# Patient Record
Sex: Female | Born: 1965 | Race: Black or African American | Hispanic: No | Marital: Married | Smoking: Never smoker
Health system: Southern US, Community
[De-identification: ages and names within clinical notes are randomized; demographics above are authoritative.]

## PROBLEM LIST (undated history)

## (undated) DIAGNOSIS — J309 Allergic rhinitis, unspecified: Secondary | ICD-10-CM

## (undated) DIAGNOSIS — Z8639 Personal history of other endocrine, nutritional and metabolic disease: Secondary | ICD-10-CM

## (undated) DIAGNOSIS — D573 Sickle-cell trait: Secondary | ICD-10-CM

## (undated) DIAGNOSIS — Z9229 Personal history of other drug therapy: Secondary | ICD-10-CM

## (undated) DIAGNOSIS — Z872 Personal history of diseases of the skin and subcutaneous tissue: Secondary | ICD-10-CM

## (undated) DIAGNOSIS — K754 Autoimmune hepatitis: Secondary | ICD-10-CM

## (undated) DIAGNOSIS — K8301 Primary sclerosing cholangitis: Secondary | ICD-10-CM

## (undated) DIAGNOSIS — D649 Anemia, unspecified: Secondary | ICD-10-CM

## (undated) HISTORY — DX: Anemia, unspecified: D64.9

## (undated) HISTORY — DX: Personal history of other drug therapy: Z92.29

## (undated) HISTORY — DX: Sickle-cell trait: D57.3

## (undated) HISTORY — DX: Allergic rhinitis, unspecified: J30.9

## (undated) HISTORY — DX: Primary sclerosing cholangitis: K83.01

## (undated) HISTORY — PX: LIVER BIOPSY: SHX301

## (undated) HISTORY — PX: TUBAL LIGATION: SHX77

## (undated) HISTORY — DX: Personal history of other endocrine, nutritional and metabolic disease: Z86.39

## (undated) HISTORY — DX: Personal history of diseases of the skin and subcutaneous tissue: Z87.2

## (undated) HISTORY — DX: Autoimmune hepatitis: K75.4

---

## 1998-10-19 ENCOUNTER — Other Ambulatory Visit: Admission: RE | Admit: 1998-10-19 | Discharge: 1998-10-19 | Payer: Self-pay | Admitting: Obstetrics and Gynecology

## 2000-11-27 ENCOUNTER — Other Ambulatory Visit: Admission: RE | Admit: 2000-11-27 | Discharge: 2000-11-27 | Payer: Self-pay | Admitting: Family Medicine

## 2004-01-19 ENCOUNTER — Ambulatory Visit: Payer: Self-pay | Admitting: Family Medicine

## 2004-01-19 ENCOUNTER — Other Ambulatory Visit: Admission: RE | Admit: 2004-01-19 | Discharge: 2004-01-19 | Payer: Self-pay | Admitting: Family Medicine

## 2004-01-27 ENCOUNTER — Encounter: Admission: RE | Admit: 2004-01-27 | Discharge: 2004-01-27 | Payer: Self-pay | Admitting: Family Medicine

## 2004-01-29 ENCOUNTER — Ambulatory Visit: Payer: Self-pay | Admitting: Family Medicine

## 2004-02-02 ENCOUNTER — Encounter: Admission: RE | Admit: 2004-02-02 | Discharge: 2004-02-02 | Payer: Self-pay | Admitting: Family Medicine

## 2004-02-12 ENCOUNTER — Encounter: Admission: RE | Admit: 2004-02-12 | Discharge: 2004-02-12 | Payer: Self-pay | Admitting: Family Medicine

## 2004-03-11 ENCOUNTER — Ambulatory Visit: Payer: Self-pay | Admitting: Internal Medicine

## 2004-03-14 ENCOUNTER — Ambulatory Visit (HOSPITAL_COMMUNITY): Admission: RE | Admit: 2004-03-14 | Discharge: 2004-03-14 | Payer: Self-pay | Admitting: Internal Medicine

## 2004-03-14 ENCOUNTER — Ambulatory Visit: Payer: Self-pay | Admitting: Internal Medicine

## 2004-04-07 ENCOUNTER — Encounter: Payer: Self-pay | Admitting: Internal Medicine

## 2004-04-26 ENCOUNTER — Encounter: Payer: Self-pay | Admitting: Internal Medicine

## 2004-04-26 HISTORY — PX: ERCP: SHX60

## 2004-07-05 ENCOUNTER — Ambulatory Visit: Payer: Self-pay | Admitting: Internal Medicine

## 2004-07-12 ENCOUNTER — Ambulatory Visit (HOSPITAL_COMMUNITY): Admission: RE | Admit: 2004-07-12 | Discharge: 2004-07-12 | Payer: Self-pay | Admitting: Internal Medicine

## 2004-07-12 ENCOUNTER — Encounter: Payer: Self-pay | Admitting: Internal Medicine

## 2004-07-12 ENCOUNTER — Encounter (INDEPENDENT_AMBULATORY_CARE_PROVIDER_SITE_OTHER): Payer: Self-pay | Admitting: Specialist

## 2004-07-26 ENCOUNTER — Ambulatory Visit: Payer: Self-pay | Admitting: Internal Medicine

## 2004-08-09 ENCOUNTER — Ambulatory Visit: Payer: Self-pay | Admitting: Internal Medicine

## 2004-08-10 ENCOUNTER — Ambulatory Visit: Payer: Self-pay | Admitting: Internal Medicine

## 2004-08-10 ENCOUNTER — Encounter: Payer: Self-pay | Admitting: Internal Medicine

## 2004-08-29 ENCOUNTER — Ambulatory Visit: Payer: Self-pay | Admitting: Internal Medicine

## 2004-09-29 ENCOUNTER — Ambulatory Visit: Payer: Self-pay | Admitting: Internal Medicine

## 2004-10-04 ENCOUNTER — Ambulatory Visit: Payer: Self-pay | Admitting: Internal Medicine

## 2004-10-18 ENCOUNTER — Ambulatory Visit: Payer: Self-pay | Admitting: Internal Medicine

## 2004-11-22 ENCOUNTER — Ambulatory Visit: Payer: Self-pay | Admitting: Internal Medicine

## 2004-12-21 ENCOUNTER — Ambulatory Visit: Payer: Self-pay | Admitting: Internal Medicine

## 2005-01-06 ENCOUNTER — Ambulatory Visit: Payer: Self-pay | Admitting: Internal Medicine

## 2005-01-25 ENCOUNTER — Ambulatory Visit: Payer: Self-pay | Admitting: Internal Medicine

## 2005-01-25 HISTORY — PX: OTHER SURGICAL HISTORY: SHX169

## 2005-03-08 ENCOUNTER — Ambulatory Visit: Payer: Self-pay | Admitting: Internal Medicine

## 2005-04-18 ENCOUNTER — Ambulatory Visit: Payer: Self-pay | Admitting: Internal Medicine

## 2005-05-30 ENCOUNTER — Ambulatory Visit: Payer: Self-pay | Admitting: Internal Medicine

## 2005-07-18 ENCOUNTER — Ambulatory Visit: Payer: Self-pay | Admitting: Internal Medicine

## 2005-09-12 ENCOUNTER — Ambulatory Visit: Payer: Self-pay | Admitting: Internal Medicine

## 2005-11-13 ENCOUNTER — Ambulatory Visit: Payer: Self-pay | Admitting: Internal Medicine

## 2006-03-13 ENCOUNTER — Ambulatory Visit: Payer: Self-pay | Admitting: Internal Medicine

## 2006-03-13 LAB — CONVERTED CEMR LAB
Albumin: 3.6 g/dL (ref 3.5–5.2)
Basophils Absolute: 0 10*3/uL (ref 0.0–0.1)
Basophils Relative: 0.7 % (ref 0.0–1.0)
Chloride: 109 meq/L (ref 96–112)
Creatinine, Ser: 0.9 mg/dL (ref 0.4–1.2)
Eosinophils Relative: 6.6 % — ABNORMAL HIGH (ref 0.0–5.0)
Glucose, Bld: 88 mg/dL (ref 70–99)
HCT: 37.7 % (ref 36.0–46.0)
Hemoglobin: 12.8 g/dL (ref 12.0–15.0)
Monocytes Relative: 6.4 % (ref 3.0–11.0)
Neutro Abs: 3.4 10*3/uL (ref 1.4–7.7)
Neutrophils Relative %: 51.6 % (ref 43.0–77.0)
RDW: 14.4 % (ref 11.5–14.6)
Sodium: 144 meq/L (ref 135–145)
Total Bilirubin: 1 mg/dL (ref 0.3–1.2)

## 2006-05-07 ENCOUNTER — Ambulatory Visit: Payer: Self-pay | Admitting: Internal Medicine

## 2006-05-07 LAB — CONVERTED CEMR LAB
AST: 122 units/L — ABNORMAL HIGH (ref 0–37)
Basophils Absolute: 0 10*3/uL (ref 0.0–0.1)
Bilirubin, Direct: 0.2 mg/dL (ref 0.0–0.3)
Eosinophils Absolute: 0.2 10*3/uL (ref 0.0–0.6)
HCT: 40.5 % (ref 36.0–46.0)
Hemoglobin: 13.6 g/dL (ref 12.0–15.0)
Lymphocytes Relative: 19 % (ref 12.0–46.0)
MCHC: 33.7 g/dL (ref 30.0–36.0)
MCV: 83.6 fL (ref 78.0–100.0)
Monocytes Absolute: 0.3 10*3/uL (ref 0.2–0.7)
Monocytes Relative: 4.1 % (ref 3.0–11.0)
Neutro Abs: 5.7 10*3/uL (ref 1.4–7.7)
Neutrophils Relative %: 73.6 % (ref 43.0–77.0)

## 2006-05-21 ENCOUNTER — Ambulatory Visit: Payer: Self-pay | Admitting: Internal Medicine

## 2006-08-02 ENCOUNTER — Ambulatory Visit: Payer: Self-pay | Admitting: Internal Medicine

## 2006-08-02 LAB — CONVERTED CEMR LAB
Alkaline Phosphatase: 170 units/L — ABNORMAL HIGH (ref 39–117)
Basophils Relative: 0 % (ref 0.0–1.0)
Eosinophils Relative: 0.7 % (ref 0.0–5.0)
HCT: 37.3 % (ref 36.0–46.0)
Hemoglobin: 12.6 g/dL (ref 12.0–15.0)
Lymphocytes Relative: 21.5 % (ref 12.0–46.0)
Neutro Abs: 7.1 10*3/uL (ref 1.4–7.7)
Neutrophils Relative %: 74.3 % (ref 43.0–77.0)
Platelets: 236 10*3/uL (ref 150–400)
Total Bilirubin: 1.3 mg/dL — ABNORMAL HIGH (ref 0.3–1.2)
Total Protein: 6.3 g/dL (ref 6.0–8.3)
WBC: 9.6 10*3/uL (ref 4.5–10.5)

## 2006-12-12 ENCOUNTER — Ambulatory Visit: Payer: Self-pay | Admitting: Family Medicine

## 2006-12-12 DIAGNOSIS — J019 Acute sinusitis, unspecified: Secondary | ICD-10-CM

## 2006-12-12 DIAGNOSIS — D573 Sickle-cell trait: Secondary | ICD-10-CM

## 2007-01-01 ENCOUNTER — Ambulatory Visit: Payer: Self-pay | Admitting: Internal Medicine

## 2007-01-01 LAB — CONVERTED CEMR LAB
Basophils Relative: 4.6 % — ABNORMAL HIGH (ref 0.0–1.0)
Eosinophils Relative: 1.8 % (ref 0.0–5.0)
HDL: 53.1 mg/dL (ref >39.0)
Hemoglobin: 12.7 g/dL (ref 12.0–15.0)
Lymphocytes Relative: 23.7 % (ref 12.0–46.0)
Monocytes Absolute: 0.3 10*3/uL (ref 0.2–0.7)
Monocytes Relative: 3.5 % (ref 3.0–11.0)
Neutro Abs: 6 10*3/uL (ref 1.4–7.7)
Total Bilirubin: 1.5 mg/dL — ABNORMAL HIGH (ref 0.3–1.2)
Total Protein: 6.8 g/dL (ref 6.0–8.3)
VLDL: 15 mg/dL (ref 0–40)
WBC: 9 10*3/uL (ref 4.5–10.5)

## 2007-01-10 ENCOUNTER — Ambulatory Visit: Payer: Self-pay | Admitting: Internal Medicine

## 2007-01-24 ENCOUNTER — Ambulatory Visit: Payer: Self-pay | Admitting: Internal Medicine

## 2007-01-24 LAB — CONVERTED CEMR LAB
ALT: 58 units/L — ABNORMAL HIGH (ref 0–35)
AST: 69 units/L — ABNORMAL HIGH (ref 0–37)
Basophils Absolute: 0.1 10*3/uL (ref 0.0–0.1)
Bilirubin, Direct: 0.3 mg/dL (ref 0.0–0.3)
Eosinophils Relative: 0.8 % (ref 0.0–5.0)
HCT: 40.8 % (ref 36.0–46.0)
Neutrophils Relative %: 74.8 % (ref 43.0–77.0)
RBC: 4.76 M/uL (ref 3.87–5.11)
RDW: 14.3 % (ref 11.5–14.6)
Total Protein: 6.9 g/dL (ref 6.0–8.3)
WBC: 7.6 10*3/uL (ref 4.5–10.5)

## 2007-02-04 ENCOUNTER — Telehealth: Payer: Self-pay | Admitting: Family Medicine

## 2007-02-06 ENCOUNTER — Ambulatory Visit: Payer: Self-pay | Admitting: Family Medicine

## 2007-03-08 ENCOUNTER — Ambulatory Visit: Payer: Self-pay | Admitting: Internal Medicine

## 2007-03-08 LAB — CONVERTED CEMR LAB
AST: 38 units/L — ABNORMAL HIGH (ref 0–37)
Albumin: 3.4 g/dL — ABNORMAL LOW (ref 3.5–5.2)
Basophils Absolute: 0.2 10*3/uL — ABNORMAL HIGH (ref 0.0–0.1)
Eosinophils Absolute: 0.1 10*3/uL (ref 0.0–0.6)
Hemoglobin: 13 g/dL (ref 12.0–15.0)
MCHC: 34 g/dL (ref 30.0–36.0)
Monocytes Absolute: 0.3 10*3/uL (ref 0.2–0.7)
Monocytes Relative: 5.1 % (ref 3.0–11.0)
RDW: 14.3 % (ref 11.5–14.6)

## 2007-03-16 DIAGNOSIS — J309 Allergic rhinitis, unspecified: Secondary | ICD-10-CM | POA: Insufficient documentation

## 2007-03-16 DIAGNOSIS — R21 Rash and other nonspecific skin eruption: Secondary | ICD-10-CM

## 2007-04-02 ENCOUNTER — Ambulatory Visit: Payer: Self-pay | Admitting: Internal Medicine

## 2007-05-01 ENCOUNTER — Ambulatory Visit: Payer: Self-pay | Admitting: Internal Medicine

## 2007-05-01 LAB — CONVERTED CEMR LAB
Bilirubin, Direct: 0.2 mg/dL (ref 0.0–0.3)
Eosinophils Absolute: 0.1 10*3/uL (ref 0.0–0.6)
Eosinophils Relative: 1.1 % (ref 0.0–5.0)
HCT: 38.8 % (ref 36.0–46.0)
Hemoglobin: 12.5 g/dL (ref 12.0–15.0)
Lymphocytes Relative: 21 % (ref 12.0–46.0)
MCV: 86.2 fL (ref 78.0–100.0)
Monocytes Absolute: 0.4 10*3/uL (ref 0.2–0.7)
Neutro Abs: 5.2 10*3/uL (ref 1.4–7.7)
Neutrophils Relative %: 72.6 % (ref 43.0–77.0)
Platelets: 239 10*3/uL (ref 150–400)
Total Bilirubin: 1.2 mg/dL (ref 0.3–1.2)
Total Protein: 6.5 g/dL (ref 6.0–8.3)
WBC: 7.2 10*3/uL (ref 4.5–10.5)

## 2007-06-18 ENCOUNTER — Encounter: Payer: Self-pay | Admitting: Internal Medicine

## 2007-06-18 ENCOUNTER — Ambulatory Visit (HOSPITAL_COMMUNITY): Admission: RE | Admit: 2007-06-18 | Discharge: 2007-06-18 | Payer: Self-pay | Admitting: Internal Medicine

## 2007-06-18 ENCOUNTER — Encounter (INDEPENDENT_AMBULATORY_CARE_PROVIDER_SITE_OTHER): Payer: Self-pay | Admitting: Interventional Radiology

## 2007-06-27 ENCOUNTER — Telehealth: Payer: Self-pay | Admitting: Internal Medicine

## 2007-07-18 ENCOUNTER — Telehealth: Payer: Self-pay | Admitting: Internal Medicine

## 2007-07-24 ENCOUNTER — Ambulatory Visit: Payer: Self-pay | Admitting: Internal Medicine

## 2007-07-26 LAB — CONVERTED CEMR LAB
ALT: 34 units/L (ref 0–35)
AST: 40 units/L — ABNORMAL HIGH (ref 0–37)
Albumin: 3.5 g/dL (ref 3.5–5.2)
Alkaline Phosphatase: 171 units/L — ABNORMAL HIGH (ref 39–117)
Basophils Relative: 0.3 % (ref 0.0–1.0)
Hemoglobin: 12.4 g/dL (ref 12.0–15.0)
Lymphocytes Relative: 20.8 % (ref 12.0–46.0)
Monocytes Relative: 5.5 % (ref 3.0–12.0)
Neutro Abs: 5.5 10*3/uL (ref 1.4–7.7)
Neutrophils Relative %: 72.2 % (ref 43.0–77.0)
RBC: 4.25 M/uL (ref 3.87–5.11)
Total Bilirubin: 1.1 mg/dL (ref 0.3–1.2)
WBC: 7.6 10*3/uL (ref 4.5–10.5)

## 2007-08-07 ENCOUNTER — Telehealth: Payer: Self-pay | Admitting: Internal Medicine

## 2007-10-23 ENCOUNTER — Telehealth (INDEPENDENT_AMBULATORY_CARE_PROVIDER_SITE_OTHER): Payer: Self-pay

## 2007-10-24 ENCOUNTER — Ambulatory Visit: Payer: Self-pay | Admitting: Internal Medicine

## 2007-10-25 LAB — CONVERTED CEMR LAB
Alkaline Phosphatase: 164 units/L — ABNORMAL HIGH (ref 39–117)
Bilirubin, Direct: 0.2 mg/dL (ref 0.0–0.3)
Eosinophils Absolute: 0.1 10*3/uL (ref 0.0–0.7)
Eosinophils Relative: 1.4 % (ref 0.0–5.0)
HCT: 36.1 % (ref 36.0–46.0)
MCV: 86.7 fL (ref 78.0–100.0)
Monocytes Absolute: 0.3 10*3/uL (ref 0.1–1.0)
Neutrophils Relative %: 77.4 % — ABNORMAL HIGH (ref 43.0–77.0)
Platelets: 273 10*3/uL (ref 150–400)
RDW: 15.5 % — ABNORMAL HIGH (ref 11.5–14.6)
Total Bilirubin: 1 mg/dL (ref 0.3–1.2)
Total Protein: 7 g/dL (ref 6.0–8.3)
WBC: 7.2 10*3/uL (ref 4.5–10.5)

## 2008-01-21 ENCOUNTER — Ambulatory Visit: Payer: Self-pay | Admitting: Internal Medicine

## 2008-01-22 LAB — CONVERTED CEMR LAB
AST: 41 units/L — ABNORMAL HIGH (ref 0–37)
Albumin: 3.4 g/dL — ABNORMAL LOW (ref 3.5–5.2)
Basophils Absolute: 0 10*3/uL (ref 0.0–0.1)
Basophils Relative: 0.1 % (ref 0.0–3.0)
Eosinophils Absolute: 0.1 10*3/uL (ref 0.0–0.7)
HCT: 37.8 % (ref 36.0–46.0)
Hemoglobin: 13 g/dL (ref 12.0–15.0)
MCHC: 34.4 g/dL (ref 30.0–36.0)
MCV: 84.5 fL (ref 78.0–100.0)
Monocytes Absolute: 0.4 10*3/uL (ref 0.1–1.0)
Neutro Abs: 5.3 10*3/uL (ref 1.4–7.7)
RBC: 4.48 M/uL (ref 3.87–5.11)
RDW: 14.7 % — ABNORMAL HIGH (ref 11.5–14.6)
Total Bilirubin: 1 mg/dL (ref 0.3–1.2)

## 2008-03-18 ENCOUNTER — Ambulatory Visit: Payer: Self-pay | Admitting: Internal Medicine

## 2008-03-18 DIAGNOSIS — K754 Autoimmune hepatitis: Secondary | ICD-10-CM

## 2008-03-18 DIAGNOSIS — E559 Vitamin D deficiency, unspecified: Secondary | ICD-10-CM | POA: Insufficient documentation

## 2008-03-18 LAB — CONVERTED CEMR LAB: Vit D, 1,25-Dihydroxy: 69 (ref 30–89)

## 2008-03-19 LAB — CONVERTED CEMR LAB
Albumin: 3.5 g/dL (ref 3.5–5.2)
Alkaline Phosphatase: 153 units/L — ABNORMAL HIGH (ref 39–117)
BUN: 9 mg/dL (ref 6–23)
Basophils Relative: 0.3 % (ref 0.0–3.0)
Calcium: 9.3 mg/dL (ref 8.4–10.5)
Chloride: 105 meq/L (ref 96–112)
Creatinine, Ser: 0.7 mg/dL (ref 0.4–1.2)
Eosinophils Relative: 1.8 % (ref 0.0–5.0)
GFR calc non Af Amer: 99 mL/min
Glucose, Bld: 88 mg/dL (ref 70–99)
HCT: 39 % (ref 36.0–46.0)
Hemoglobin: 13 g/dL (ref 12.0–15.0)
MCV: 86.1 fL (ref 78.0–100.0)
Monocytes Absolute: 1.1 10*3/uL — ABNORMAL HIGH (ref 0.1–1.0)
Monocytes Relative: 14.4 % — ABNORMAL HIGH (ref 3.0–12.0)
Platelets: 275 10*3/uL (ref 150–400)
Potassium: 3.8 meq/L (ref 3.5–5.1)
RBC: 4.53 M/uL (ref 3.87–5.11)
WBC: 7.5 10*3/uL (ref 4.5–10.5)

## 2008-05-27 ENCOUNTER — Telehealth (INDEPENDENT_AMBULATORY_CARE_PROVIDER_SITE_OTHER): Payer: Self-pay | Admitting: *Deleted

## 2008-06-23 ENCOUNTER — Ambulatory Visit: Payer: Self-pay | Admitting: Internal Medicine

## 2008-06-24 LAB — CONVERTED CEMR LAB
AST: 36 units/L (ref 0–37)
Alkaline Phosphatase: 171 units/L — ABNORMAL HIGH (ref 39–117)
Basophils Absolute: 0 10*3/uL (ref 0.0–0.1)
Bilirubin, Direct: 0.2 mg/dL (ref 0.0–0.3)
Lymphocytes Relative: 23 % (ref 12.0–46.0)
Monocytes Relative: 4.1 % (ref 3.0–12.0)
Platelets: 239 10*3/uL (ref 150.0–400.0)
RDW: 14.1 % (ref 11.5–14.6)
Total Bilirubin: 0.9 mg/dL (ref 0.3–1.2)
WBC: 7.5 10*3/uL (ref 4.5–10.5)

## 2008-06-26 ENCOUNTER — Telehealth: Payer: Self-pay | Admitting: Internal Medicine

## 2008-07-14 ENCOUNTER — Ambulatory Visit: Payer: Self-pay | Admitting: Family Medicine

## 2008-07-17 LAB — CONVERTED CEMR LAB
AST: 42 units/L — ABNORMAL HIGH (ref 0–37)
Alkaline Phosphatase: 159 units/L — ABNORMAL HIGH (ref 39–117)
Basophils Absolute: 0 10*3/uL (ref 0.0–0.1)
Blood in Urine, dipstick: NEGATIVE
Calcium: 9.1 mg/dL (ref 8.4–10.5)
GFR calc non Af Amer: 118.46 mL/min (ref >60)
Glucose, Bld: 76 mg/dL (ref 70–99)
HDL: 54.6 mg/dL (ref >39.00)
Hemoglobin: 13.1 g/dL (ref 12.0–15.0)
LDL Cholesterol: 126 mg/dL — ABNORMAL HIGH (ref 0–99)
Lymphocytes Relative: 22.4 % (ref 12.0–46.0)
Monocytes Relative: 6.4 % (ref 3.0–12.0)
Neutro Abs: 4.2 10*3/uL (ref 1.4–7.7)
Nitrite: POSITIVE
Protein, U semiquant: NEGATIVE
RDW: 14 % (ref 11.5–14.6)
Sodium: 144 meq/L (ref 135–145)
Total Bilirubin: 1.3 mg/dL — ABNORMAL HIGH (ref 0.3–1.2)
Total CHOL/HDL Ratio: 4
Urobilinogen, UA: 0.2
VLDL: 13 mg/dL (ref 0.0–40.0)
pH: 5

## 2008-07-21 ENCOUNTER — Other Ambulatory Visit: Admission: RE | Admit: 2008-07-21 | Discharge: 2008-07-21 | Payer: Self-pay | Admitting: Family Medicine

## 2008-07-21 ENCOUNTER — Encounter: Payer: Self-pay | Admitting: Family Medicine

## 2008-07-21 ENCOUNTER — Ambulatory Visit: Payer: Self-pay | Admitting: Family Medicine

## 2008-07-24 ENCOUNTER — Encounter: Payer: Self-pay | Admitting: Family Medicine

## 2008-07-27 ENCOUNTER — Ambulatory Visit: Payer: Self-pay | Admitting: Internal Medicine

## 2008-08-26 ENCOUNTER — Telehealth: Payer: Self-pay | Admitting: Internal Medicine

## 2008-11-11 ENCOUNTER — Telehealth: Payer: Self-pay | Admitting: Internal Medicine

## 2008-12-03 ENCOUNTER — Telehealth: Payer: Self-pay | Admitting: Internal Medicine

## 2008-12-14 ENCOUNTER — Telehealth: Payer: Self-pay | Admitting: Internal Medicine

## 2008-12-17 ENCOUNTER — Ambulatory Visit: Payer: Self-pay | Admitting: Internal Medicine

## 2008-12-21 LAB — CONVERTED CEMR LAB
Albumin: 3.5 g/dL (ref 3.5–5.2)
Basophils Absolute: 0 10*3/uL (ref 0.0–0.1)
Eosinophils Absolute: 0.1 10*3/uL (ref 0.0–0.7)
HCT: 36.8 % (ref 36.0–46.0)
Hemoglobin: 12.1 g/dL (ref 12.0–15.0)
Lymphs Abs: 1.8 10*3/uL (ref 0.7–4.0)
MCHC: 32.9 g/dL (ref 30.0–36.0)
MCV: 88.8 fL (ref 78.0–100.0)
Neutro Abs: 4.9 10*3/uL (ref 1.4–7.7)
RDW: 15 % — ABNORMAL HIGH (ref 11.5–14.6)

## 2009-03-11 ENCOUNTER — Ambulatory Visit: Payer: Self-pay | Admitting: Internal Medicine

## 2009-03-15 LAB — CONVERTED CEMR LAB
CO2: 30 meq/L (ref 19–32)
Calcium: 9.9 mg/dL (ref 8.4–10.5)
Chloride: 104 meq/L (ref 96–112)
Eosinophils Relative: 0.3 % (ref 0.0–5.0)
GFR calc non Af Amer: 118.08 mL/min (ref >60)
Glucose, Bld: 94 mg/dL (ref 70–99)
HCT: 40.1 % (ref 36.0–46.0)
Hemoglobin: 12.7 g/dL (ref 12.0–15.0)
Lymphocytes Relative: 18.9 % (ref 12.0–46.0)
Lymphs Abs: 1.4 10*3/uL (ref 0.7–4.0)
Monocytes Relative: 5 % (ref 3.0–12.0)
Neutro Abs: 5.7 10*3/uL (ref 1.4–7.7)
Platelets: 245 10*3/uL (ref 150.0–400.0)
Sodium: 143 meq/L (ref 135–145)
Total Bilirubin: 1.1 mg/dL (ref 0.3–1.2)
Total Protein: 7.2 g/dL (ref 6.0–8.3)
WBC: 7.5 10*3/uL (ref 4.5–10.5)

## 2009-03-16 ENCOUNTER — Telehealth: Payer: Self-pay | Admitting: Internal Medicine

## 2009-03-19 ENCOUNTER — Encounter (INDEPENDENT_AMBULATORY_CARE_PROVIDER_SITE_OTHER): Payer: Self-pay

## 2009-04-19 ENCOUNTER — Telehealth: Payer: Self-pay | Admitting: Internal Medicine

## 2009-04-22 ENCOUNTER — Encounter: Payer: Self-pay | Admitting: Internal Medicine

## 2009-04-22 ENCOUNTER — Telehealth: Payer: Self-pay | Admitting: Internal Medicine

## 2009-05-17 ENCOUNTER — Encounter: Payer: Self-pay | Admitting: Family Medicine

## 2009-05-17 ENCOUNTER — Ambulatory Visit: Payer: Self-pay | Admitting: Gastroenterology

## 2009-05-20 ENCOUNTER — Encounter: Payer: Self-pay | Admitting: Internal Medicine

## 2009-06-08 ENCOUNTER — Encounter: Payer: Self-pay | Admitting: Internal Medicine

## 2009-06-17 ENCOUNTER — Telehealth: Payer: Self-pay | Admitting: Family Medicine

## 2009-07-28 ENCOUNTER — Encounter (INDEPENDENT_AMBULATORY_CARE_PROVIDER_SITE_OTHER): Payer: Self-pay | Admitting: *Deleted

## 2009-08-24 ENCOUNTER — Encounter: Payer: Self-pay | Admitting: Internal Medicine

## 2009-09-06 ENCOUNTER — Ambulatory Visit: Payer: Self-pay | Admitting: Internal Medicine

## 2009-10-07 ENCOUNTER — Ambulatory Visit: Payer: Self-pay | Admitting: Internal Medicine

## 2009-10-28 ENCOUNTER — Telehealth: Payer: Self-pay | Admitting: Internal Medicine

## 2009-12-13 ENCOUNTER — Ambulatory Visit: Payer: Self-pay | Admitting: Gastroenterology

## 2009-12-13 ENCOUNTER — Encounter: Payer: Self-pay | Admitting: Family Medicine

## 2010-01-13 ENCOUNTER — Telehealth: Payer: Self-pay | Admitting: Internal Medicine

## 2010-01-30 IMAGING — US US BIOPSY
1 series · 12 of 12 positions shown · non-contrast
Comparison: none

Clinical: Autoimmune hepatitis; request is made for random core
liver biopsy

ULTRASOUND-GUIDED RANDOM CORE LIVER BIOPSY:
An ultrasound guided liver biopsy was thoroughly discussed with the
patient and questions were answered. The benefits, risks,
alternatives, and complications were also discussed. The patient
understands and wishes to proceed with the procedure. A verbal as
well as written consent was obtained.
Survey ultrasound of the liver was performed and an appropriate
skin entry site was determined. Skin site was marked, prepped with
Betadine, and draped in usual sterile fashion, and infiltrated
locally with 1% Lidocaine. 100 mcg Intravenous Fentanyl and 4 mg
Intravenous Versed were administered as conscious sedation for 55
minutes during continuous cardiorespiratory monitoring by the
radiology RN. A 17 gauge Trocar needle was advanced under
ultrasound guidance into the liver.  3 coaxial 18 gauge core
samples were then obtained through the guide needle. The guide
needle was removed. Post procedure scans demonstrate no apparent
complication.

[Series 1: unknown · 0.33mm/px · 12 of 12 slices shown]
[im 1/12]
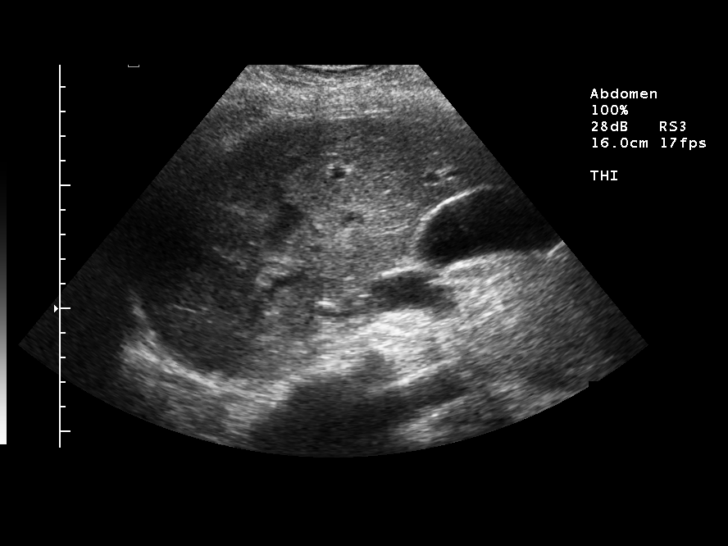
[im 2/12]
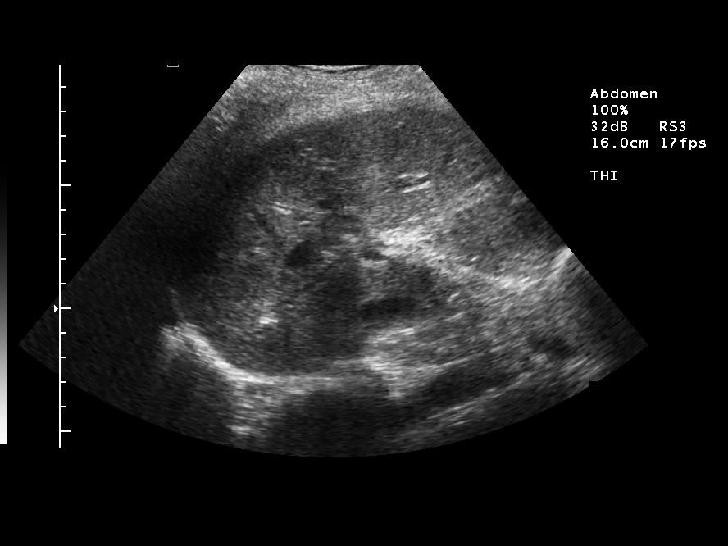
[im 3/12]
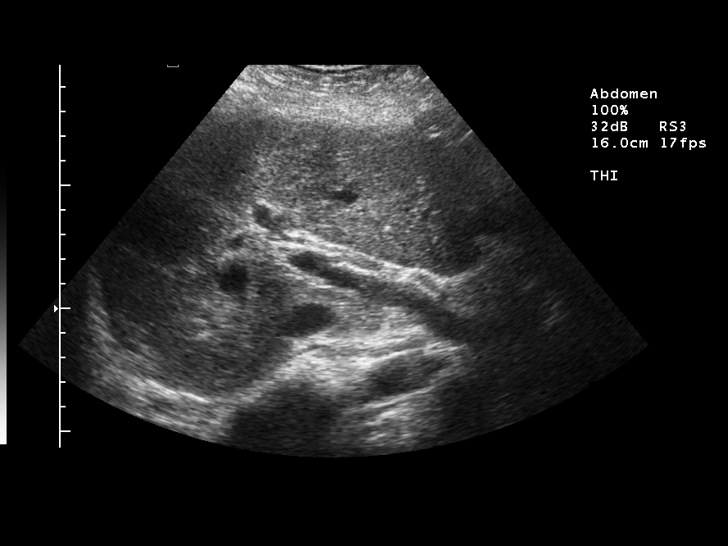
[im 4/12]
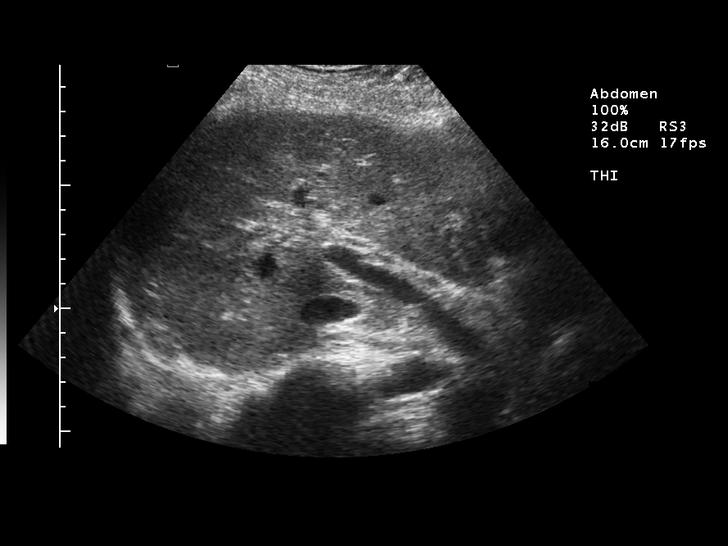
[im 5/12]
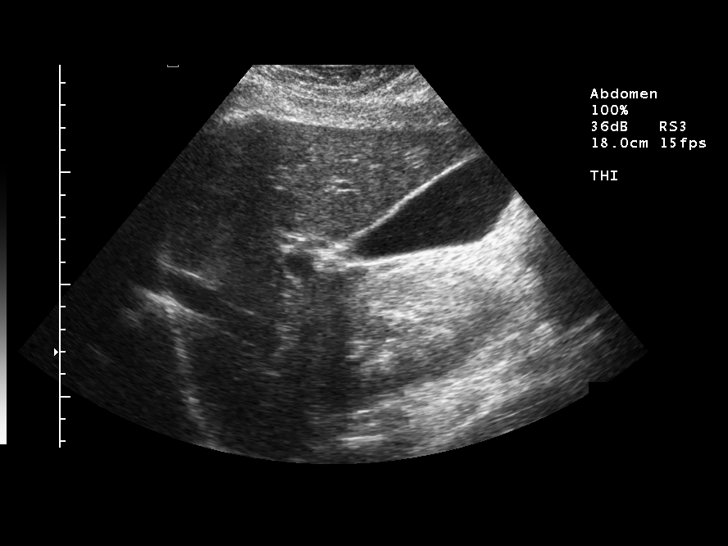
[im 6/12]
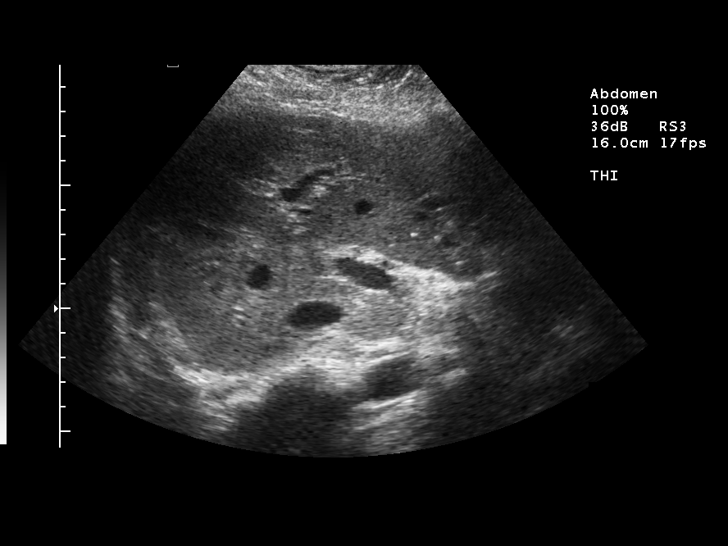
[im 7/12]
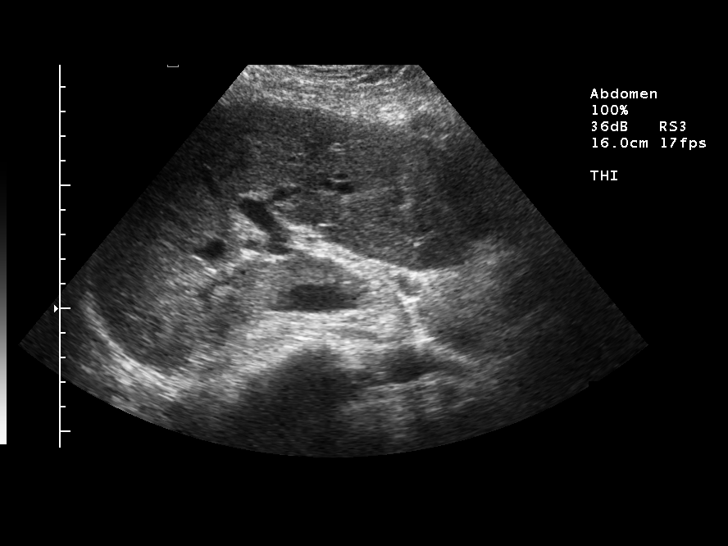
[im 8/12]
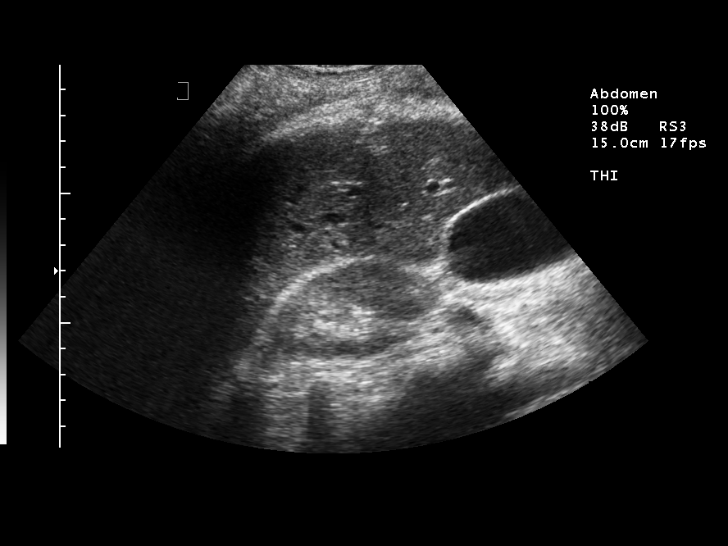
[im 9/12]
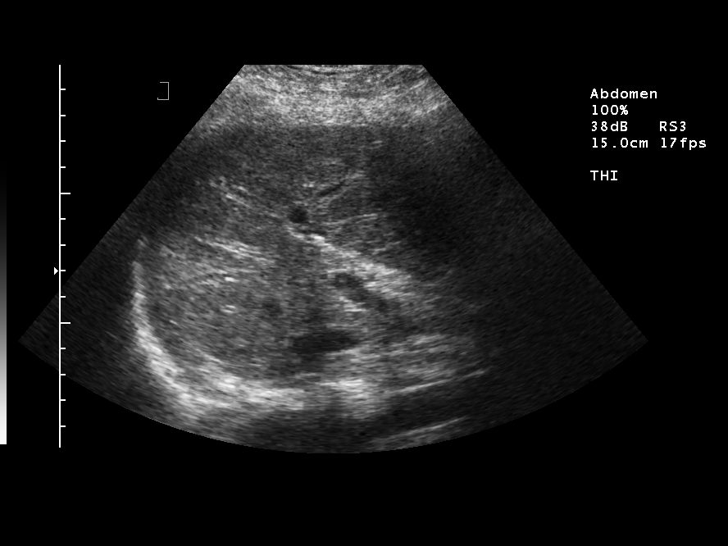
[im 10/12]
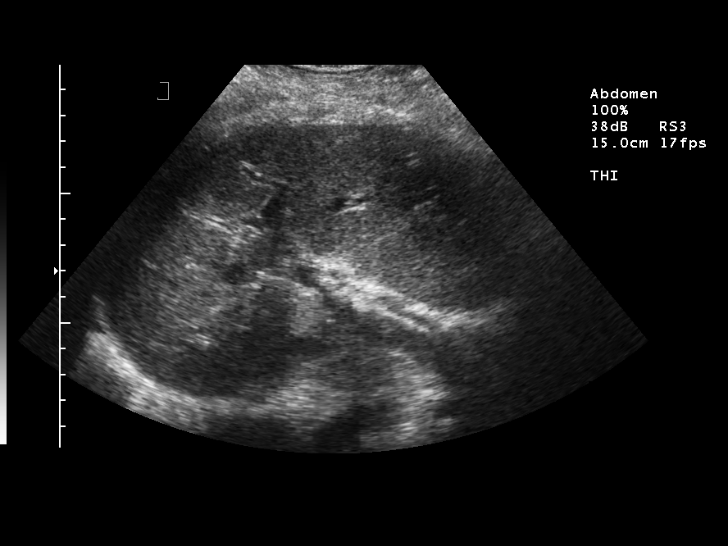
[im 11/12]
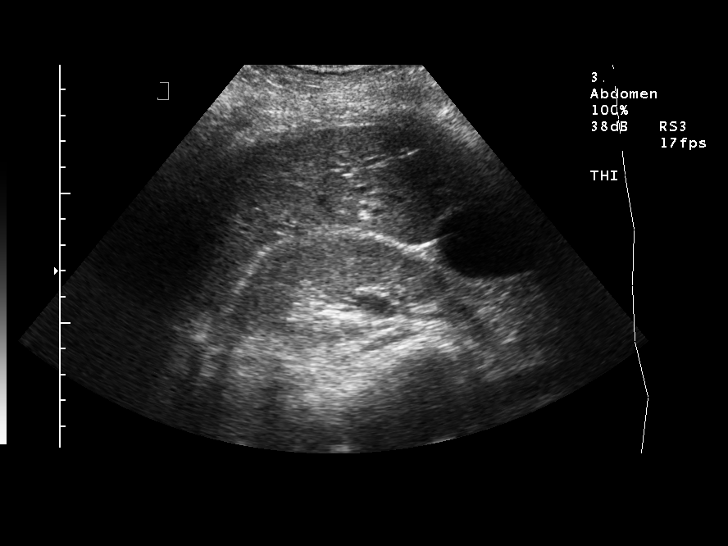
[im 12/12]
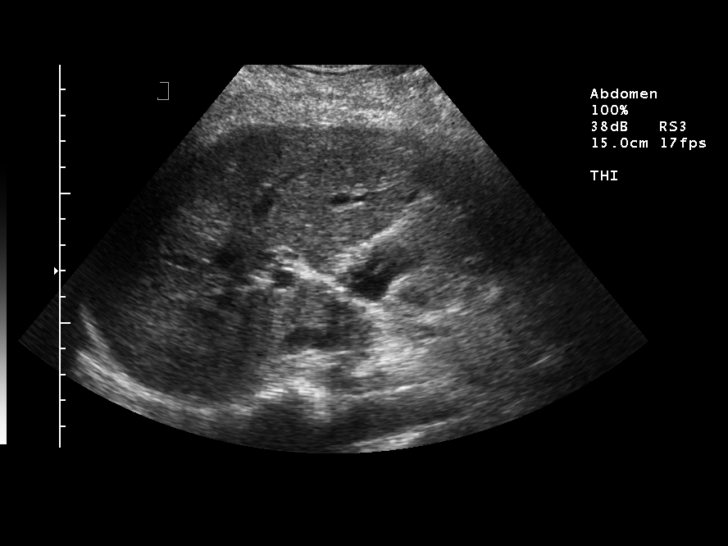

[12 of 12 positions shown; findings below may reference images not displayed]

IMPRESSION: 1. Technically successful ultrasound guided random core liver
biopsy as discussed above.

Read by: Antonio, Ramie.-PRAIZ

## 2010-02-16 ENCOUNTER — Ambulatory Visit: Payer: Self-pay | Admitting: Internal Medicine

## 2010-02-25 LAB — CONVERTED CEMR LAB
AST: 57 units/L — ABNORMAL HIGH (ref 0–37)
Albumin: 3.7 g/dL (ref 3.5–5.2)
Total Bilirubin: 1 mg/dL (ref 0.3–1.2)

## 2010-03-08 ENCOUNTER — Other Ambulatory Visit: Payer: Self-pay | Admitting: Internal Medicine

## 2010-03-08 ENCOUNTER — Ambulatory Visit
Admission: RE | Admit: 2010-03-08 | Discharge: 2010-03-08 | Payer: Self-pay | Source: Home / Self Care | Attending: Internal Medicine | Admitting: Internal Medicine

## 2010-03-08 LAB — CBC WITH DIFFERENTIAL/PLATELET
Basophils Absolute: 0 10*3/uL (ref 0.0–0.1)
Basophils Relative: 0.3 % (ref 0.0–3.0)
Eosinophils Absolute: 0 10*3/uL (ref 0.0–0.7)
Eosinophils Relative: 0.6 % (ref 0.0–5.0)
HCT: 39.7 % (ref 36.0–46.0)
Hemoglobin: 13.5 g/dL (ref 12.0–15.0)
Lymphocytes Relative: 22 % (ref 12.0–46.0)
Lymphs Abs: 1.4 10*3/uL (ref 0.7–4.0)
MCHC: 34 g/dL (ref 30.0–36.0)
MCV: 85.3 fl (ref 78.0–100.0)
Monocytes Absolute: 0.3 10*3/uL (ref 0.1–1.0)
Monocytes Relative: 4.9 % (ref 3.0–12.0)
Neutro Abs: 4.5 10*3/uL (ref 1.4–7.7)
Neutrophils Relative %: 72.2 % (ref 43.0–77.0)
Platelets: 253 10*3/uL (ref 150.0–400.0)
RBC: 4.65 Mil/uL (ref 3.87–5.11)
RDW: 15.9 % — ABNORMAL HIGH (ref 11.5–14.6)
WBC: 6.3 10*3/uL (ref 4.5–10.5)

## 2010-03-14 ENCOUNTER — Telehealth: Payer: Self-pay | Admitting: Internal Medicine

## 2010-03-22 ENCOUNTER — Telehealth: Payer: Self-pay | Admitting: Internal Medicine

## 2010-03-22 ENCOUNTER — Ambulatory Visit
Admission: RE | Admit: 2010-03-22 | Payer: Self-pay | Source: Home / Self Care | Attending: Internal Medicine | Admitting: Internal Medicine

## 2010-03-22 ENCOUNTER — Encounter: Payer: Self-pay | Admitting: Internal Medicine

## 2010-03-22 NOTE — Letter (Signed)
Summary: Office Visit Letter  [facility]  [address] [place], [place] [address]   Phone: [phone]  Fax: [phone]      July 28, 2009 MRN: [id number]   [facility] [address] [place], [place]  [address]   Dear Ms. Waterson,   According to our records, it is time for you to schedule a follow-up office visit with [place].   At your convenience, please call [phone] (option #[phone])to schedule an office visit. If you have any questions, concerns, or feel that this letter is in error, we would appreciate your call.   Sincerely,    [name], M.D.  [facility] [phone]

## 2010-03-22 NOTE — Assessment & Plan Note (Signed)
Summary: HEP INJ #2...AS.  Nurse Visit   Allergies: 1)  ! * No Live Vaccines  Immunizations Administered:  Hepatitis B Vaccine # 2:    Vaccine Type: HepB Adolescent    Site: right deltoid    Mfr: Merck    Dose: 0.5 ml    Route: IM    Given by: Ok Anis CMA    Exp. Date: 06/20/2011    Lot #: [id number]    VIS given: 09/06/05 version given October 07, 2009.  Orders Added: 1)  Hepatitis B Vaccine ADOLESCENT (2 dose) [90743] 2)  Admin 1st Vaccine [57846]

## 2010-03-22 NOTE — Medication Information (Signed)
Summary: Ursodiol Approved/[facility]  Ursodiol Approved/[facility]   Imported By: Sherian Rein 04/26/2009 09:51:52  _____________________________________________________________________  External Attachment:    Type:   Image     Comment:   External Document

## 2010-03-22 NOTE — Letter (Signed)
Summary: [facility] Care-Liver [facility]  [facility] Care-Liver Program   Imported By: Maryln Gottron 12/21/2009 13:48:44  _____________________________________________________________________  External Attachment:    Type:   Image     Comment:   External Document

## 2010-03-22 NOTE — Letter (Signed)
Summary: [facility]  [facility]   Imported By: Lester Lillie 06/15/2009 09:30:14  _____________________________________________________________________  External Attachment:    Type:   Image     Comment:   External Document

## 2010-03-22 NOTE — Miscellaneous (Signed)
Summary: [facility] referral  Clinical Lists Changes  Orders: Added new Test order of [facility] - Liver Program Odie Sera) - Signed Added new Test order of [facility] - Liver Program Odie Sera) - Signed

## 2010-03-22 NOTE — Progress Notes (Signed)
Summary: yeast  Phone Note Call from Patient Call back at Work Phone [phone] Call back at [phone]   Summary of Call: Dentist gave Rx for infection/ root canal monday - antibiotic.  Has vaginal yeast now & requests med.  [facility]  [facility]. Initial call taken by: Rudy Jew, RN,  June 17, 2009 9:29 AM  Follow-up for Phone Call        call in Diflucan 150 mg , #1 with 5 rf Follow-up by: Nelwyn Salisbury MD,  June 17, 2009 11:47 AM    New/Updated Medications: DIFLUCAN 150 MG TABS (FLUCONAZOLE) one now Prescriptions: DIFLUCAN 150 MG TABS (FLUCONAZOLE) one now  #1 x 5   Entered by:   Lynann Beaver CMA   Authorized by:   Nelwyn Salisbury MD   Signed by:   Lynann Beaver CMA on 06/17/2009   Method used:   [id number]       [address]       [place], [place]  [address]       Ph: [phone] or [phone]       Fax: [phone]   RxID:   [id number]  Pt. notified.

## 2010-03-22 NOTE — Progress Notes (Signed)
Summary: Triage   Phone Note Call from Patient Call back at Work Phone [phone]   Caller: Patient Call For: Dr. Leone Payor Reason for Call: Talk to Nurse Summary of Call: Pt would like to know the status of the letter for her social worker. Initial call taken by: Karna Christmas,  April 22, 2009 9:01 AM  Follow-up for Phone Call        done Follow-up by: [name] MD, Clementeen Graham,  April 22, 2009 12:02 PM     Appended Document: Triage letter left out front for the patient she is aware

## 2010-03-22 NOTE — Progress Notes (Signed)
Summary: Triage   Phone Note Call from Patient Call back at [phone]   Caller: Patient Call For: Dr. Leone Payor Reason for Call: Talk to Nurse Summary of Call: Pt. said she is having some abdominal pain Initial call taken by: Karna Christmas,  March 16, 2009 11:11 AM  Follow-up for Phone Call        Patient  with a dull pain that started in her stomach yesterday evening.  Pain comes and goes.  She is on  URSO.  Pain is located to the left of her umbilicus.  She denies constipation fever or diarrhea.  She wanted Dr Leone Payor to be aware Follow-up by: Darcey Nora RN, CGRN,  March 16, 2009 11:47 AM  Additional Follow-up for Phone Call Additional follow up Details #1::        she should monitor this, if fails to resolve within a few days or worsens call back or to ED Monitor for fever also Additional Follow-up by: [name] MD, Clementeen Graham,  March 16, 2009 12:21 PM    Additional Follow-up for Phone Call Additional follow up Details #2::    patient aware. Follow-up by: Darcey Nora RN, CGRN,  March 16, 2009 1:13 PM

## 2010-03-22 NOTE — Progress Notes (Signed)
Summary: Medication-Azathioprine  Phone Note Call from Patient Call back at [phone]   Caller: Patient Call For: Dr. Leone Payor Reason for Call: Talk to Nurse Summary of Call: Would like to discuss her med. Her mail order forgot to send her Azathiprine and she will be out tomorrow. Wants to know what she can do? Initial call taken by: Karna Christmas,  October 28, 2009 2:57 PM  Follow-up for Phone Call        Advised pt I do not have any samples, but I could call a 30 day rx to her local pharmacy to get her through until mail order comes in.  Pt is agreeable.  I asked pt if she is out of refills for her mail order and she said she has 2 refills left.  Advised pt I would put a refill on local rx in case this happens again with mail order.  [facility] Follow-up by: Francee Piccolo CMA Duncan Dull),  October 28, 2009 3:52 PM    Prescriptions: AZATHIOPRINE 50 MG  TABS (AZATHIOPRINE) 3 by mouth q d  #90 x 1   Entered by:   Francee Piccolo CMA (AAMA)   Authorized by:   [name] MD, [facility]   Signed by:   Francee Piccolo CMA (AAMA) on 10/28/2009   Method used:   Electronically to        [id number]       [address]       [place], [place]  [address]       Ph: [phone] or [phone]       Fax: [phone]   RxID:   [id number]

## 2010-03-22 NOTE — Assessment & Plan Note (Signed)
Summary: Hep B #1 of 3/ dx 571.42/sheri  Nurse Visit   Allergies: 1)  ! * No Live Vaccines  Immunizations Administered:  Hepatitis B Vaccine # 1:    Vaccine Type: HepB Adolescent    Site: left deltoid    Mfr: Merck    Dose: 0.5 ml    Route: IM    Given by: Chales Abrahams CMA (AAMA)    Exp. Date: 06/20/2011    Lot #: [id number]    VIS given: 09/06/05 version given September 06, 2009.  Orders Added: 1)  Hepatitis B Vaccine ADOLESCENT (2 dose) [90743] 2)  Admin 1st Vaccine [04540]

## 2010-03-22 NOTE — Progress Notes (Signed)
Summary: Needs a letter from Dr   Phone Note Call from Patient Call back at Work Phone [phone]   Call For: Dr Leone Payor Summary of Call: Dr forgot to put her weight & height on the form he filled out for her. Also needs something in writing that says she is well enough to continue [facility]. Initial call taken by: Leanor Kail [facility],  April 19, 2009 10:16 AM  Follow-up for Phone Call        Patient  needs a letter to her Social Worker Alanson Puls stating she is well enough to care for childern.  Patient  will come by and pick up the letter.  patient is provided her last documented height an d weght from 03-11-09 Follow-up by: Darcey Nora RN, CGRN,  April 19, 2009 10:29 AM  Additional Follow-up for Phone Call Additional follow up Details #1::        Patient  called again this am and when you please write her letter please make sure it has her actual diagnosis and that she is able to  care for foster children with her disease Follow-up by: Darcey Nora RN, CGRN,  April 20, 2009 8:58 AM    Additional Follow-up for Phone Call Additional follow up Details #2::    done  Follow-up by: [name] MD, Clementeen Graham,  April 22, 2009 1:29 PM

## 2010-03-22 NOTE — Consult Note (Signed)
Summary: [facility] Care-Liver Program  [facility] Care-Liver [facility]   Imported By: Maryln Gottron 05/26/2009 11:24:11  _____________________________________________________________________  External Attachment:    Type:   Image     Comment:   External Document  Appended Document: [facility] Care-Liver Program please see if Dr. Julieta Gutting was able to review libver bxs and ERCP info as written in this note also, I think we vaccinated her ...  Appended Document: [facility] Program She does have a letter from Dr Brunilda Payor.  She was also advised she needs the Hep B series.  She can't remember if she had the series, but her labs were negative.  She will come for #1 of 3 on Monday. I have asked her to please bring [place] the letter from Dr Brunilda Payor and we will make a copy for our files.

## 2010-03-22 NOTE — Letter (Signed)
Summary: Generic Letter  [facility]  [address] [place], [place] [address]   Phone: [phone]  Fax: [phone]    04/22/2009  To Whom it May Concern:  Re:  [facility] [address] [place], [place]  [address]  Ms. [name] Short has been my patient for several years and has a condition known as autoimmune hepatitis and cholangits.   It is my opinion that this medical condition does not impair her ability to be a foster parent. Thus, I believe she should be allowed to serve as a foster parent.     Sincerely,   Stan Head MD, [facility]

## 2010-03-22 NOTE — Assessment & Plan Note (Signed)
Summary: AIH-Cholangitis    History of Present Illness Visit Type: Follow-up Visit Primary GI MD: Stan Head MD [facility] Primary Provider: Gershon Crane, MD Chief Complaint: hepatitis autoimmune History of Present Illness:   45 year old African American woman with autoimmune hepatitis and cholangitis overlap syndrome. She has been treated with ursodiol, azathioprine and prednisone. Due to lack of insurance for several months she was unable to take her ursodiol but maintained her other medications. Last laboratory testing was in October. She presents for routine followup today, without any specific complaints. She is overall feeling well. She has a chronic skin rash that is bothering her slightly at this time. She has no abdominal pain or other gastrointestinal symptoms.  She did recently restart her ursodiol 6 days ago, now that she has medical insurance again. Her husband is in failing health with CHF after an MI. He also has declining kidney function.   GI Review of Systems      Denies abdominal pain, acid reflux, belching, bloating, chest pain, dysphagia with liquids, dysphagia with solids, heartburn, loss of appetite, nausea, vomiting, vomiting blood, weight loss, and  weight gain.      Reports liver problems.     Denies anal fissure, black tarry stools, change in bowel habit, constipation, diarrhea, diverticulosis, fecal incontinence, heme positive stool, hemorrhoids, irritable bowel syndrome, jaundice, light color stool, rectal bleeding, and  rectal pain.    Current Medications (verified): 1)  Caltrate 600 1500 Mg  Tabs (Calcium Carbonate) .... 2  By Mouth Once Daily 2)  Integra 62.5-62.5-40-3 Mg Caps (Fe Fum-Fepoly-Vit C-Vit B3) .... Once Daily 3)  Multivitamins   Tabs (Multiple Vitamin) .Marland Kitchen.. 1 By Mouth Once Daily 4)  Prednisone 5 Mg Tabs (Prednisone) .... Take 1/2 Tablet By Mouth Once Daily 5)  Ursodiol 300 Mg Caps (Ursodiol) .... Take 1 Capsule By Mouth Three Times A Day 6)   Azathioprine 50 Mg  Tabs (Azathioprine) .... 3 By Mouth Q D 7)  Vitamin D 1000 Unit  Tabs (Cholecalciferol) .... Once Daily 8)  Ferrous Sulfate 325 (65 Fe) Mg Tabs (Ferrous Sulfate) .... Once Daily  Allergies (verified): 1)  ! * No Live Vaccines  Past History:  Past Medical History: Autoimmune hepatitis-cholangitis overlap syndrome, sees Dr. Leone Payor, last biopsy 4-09 Skin Rashes, sees Dr. Amy [place] Vitamin D deficiency normal DEXA 11-08 Sickle Trait (? when testing performed) Vaccinated for HAV and HBV  Past Surgical History: tubal ligation Tonsillectomy  Family History: Family History of Arthritis Family History Hypertension Family History Kidney disease Family History of Diabetes: Mother  Social History: Reviewed history from 03/18/2008 and no changes required. Married: 1 son and 1 daughter Never Smoked Alcohol use-no Drug use-no Occupation: Personnel officer  Vital Signs:  Patient profile:   45 year old female Height:      67 inches Weight:      178.13 pounds BMI:     28.00 Pulse rate:   68 / minute Pulse rhythm:   regular BP sitting:   118 / 78  (left arm) Cuff size:   regular  Vitals Entered By: June McMurray CMA Duncan Dull) (March 11, 2009 3:44 PM)  Physical Exam  General:  Well developed, well nourished, no acute distress. Eyes:  anicteric Lungs:  Clear throughout to auscultation. Heart:  Regular rate and rhythm; no murmurs, rubs,  or bruits. Abdomen:  Soft, nontender and nondistended. No masses, hepatosplenomegaly or hernias noted. Normal bowel sounds. 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 S/P HAV and HBV vaccinations LFT's have never normalized though have improved  with AZA, prednisone and Urso she was open to hepatology evaluation when discussed last Summer I think this could be useful to her long-term, to evaluat  the management I have implemented ? repeat bx this yr  or perfom  MRCP   Orders: TLB-CBC Platelet - w/Differential (85025-CBCD) TLB-CMP (Comprehensive Metabolic Pnl) (80053-COMP)  Problem # 2:  LONG-TERM USE OF AZATHIOPRINE (ICD-V58.69) Assessment: Unchanged no toxicity to date labs today and every 3 mos at a minimum  Problem # 3:  LONG-TERM USE OF STEROIDS (ICD-V58.65) Assessment: Unchanged repeat DEXA this year (will defer at this time) would be appropriate  Problem # 4:  SKIN RASH (ICD-782.1) Assessment: Unchanged she does not c/o pruritis but ? if liver disease is driving this she says she will return to dermatology  Problem # 5:  COLORECTAL CANCER, FAMILY HX (ICD-V16.0) Assessment: Comment Only a great aunt had colon cancer this is not a significant issue as it is a distant relative - will remove from problem list she is routine risk and should have colonoscopy at age 14 (African-American)  Patient Instructions: 1)  You will go to the basement today for labs 2)  We will send in your refills today 3)  Discontinue Iron 4)  Return to the office for a follow up in 6 months 5)  The medication list was reviewed and reconciled.  All changed / newly prescribed medications were explained.  A complete medication list was provided to the patient / caregiver. Prescriptions: PREDNISONE 5 MG TABS (PREDNISONE) take 1/2 tablet by mouth once daily  #45 x 3   Entered by:   Merri Ray CMA (AAMA)   Authorized by:   [name] MD, [facility]   Signed by:   Merri Ray CMA (AAMA) on 03/11/2009   Method used:   Electronically to        [facility]  [phone]* (retail)       [address]       [place], [place]  [address]       Ph: [phone] or [phone]       Fax: [phone]   RxID:    [id number] AZATHIOPRINE 50 MG  TABS (AZATHIOPRINE) 3 by mouth q d  #90 x 3   Entered by:   Merri Ray CMA (AAMA)   Authorized by:   [name] MD, [facility]   Signed by:   Merri Ray CMA (AAMA) on 03/11/2009   Method used:   Electronically to        [facility]  [phone]* (retail)       [address]       [place], [place]  [address]       Ph: [phone] or [phone]       Fax: [phone]   RxID:   [id number]   Appended Document: AIH-Cholangitis

## 2010-03-22 NOTE — Letter (Signed)
Summary: Care Consideration/[employer]  Care Consideration/[employer]   Imported By: Sherian Rein 09/06/2009 13:33:23  _____________________________________________________________________  External Attachment:    Type:   Image     Comment:   External Document

## 2010-03-24 NOTE — Progress Notes (Signed)
Summary: [facility]   Imported By: Sherian Rein 03/15/2010 09:37:02  _____________________________________________________________________  External Attachment:    Type:   Image     Comment:   External Document

## 2010-03-24 NOTE — Progress Notes (Signed)
Summary: [facility]   Imported By: Sherian Rein 03/15/2010 09:35:13  _____________________________________________________________________  External Attachment:    Type:   Image     Comment:   External Document

## 2010-03-24 NOTE — Progress Notes (Signed)
Summary: due for DEXA   Phone Note Outgoing Call   Summary of Call: let her know CBC was ok last week last bone density in 2008 she should have one again this year, anytime she is ready due to long-term steroid use and also chrnic liver disease [name] MD, [facility]  March 14, 2010 4:50 PM   Follow-up for Phone Call        DEXA scheduled with the patient for 03/17/10 2;00 Follow-up by: Darcey Nora RN, CGRN,  March 14, 2010 5:00 PM

## 2010-03-24 NOTE — Progress Notes (Signed)
Summary: [facility]   Imported By: Sherian Rein 03/15/2010 09:44:25  _____________________________________________________________________  External Attachment:    Type:   Image     Comment:   External Document

## 2010-03-24 NOTE — Consult Note (Signed)
Summary: [facility]   Imported By: Sherian Rein 03/15/2010 09:30:11  _____________________________________________________________________  External Attachment:    Type:   Image     Comment:   External Document

## 2010-03-24 NOTE — Progress Notes (Signed)
Summary: decrease prednisone, set up rev  Medications Added PREDNISONE 5 MG TABS (PREDNISONE) take 1/2 tablet by mouth every other day       Phone Note Outgoing Call   Summary of Call: 1) please haver arrange a follow-up visit with me  in jan 2) change prednisone to 1/2 tab (2.5 milligrams) evey other day 3) LFT's end of dec 4) Dr. Julieta Gutting recommended decrease in prednisone, we will be trying to reduce her meds [name] MD, [facility]  January 13, 2010 9:02 AM   Follow-up for Phone Call        Notified pt at work of need for f/u appt on 03/08/10 @ 0830, LFT's last week of December, decrease Prednisone to 1/2 tab or 2.5mg  every other day; pt stated understanding  Graciella Freer RN  January 17, 2010 9:09 AM    New/Updated Medications: PREDNISONE 5 MG TABS (PREDNISONE) take 1/2 tablet by mouth every other day  Appended Document: decrease prednisone, set up rev patient advised to come for lab work this week. She will come tomorrow or Friday

## 2010-03-24 NOTE — Consult Note (Signed)
Summary: Gastroenterology/[facility]  Gastroenterology/[facility]   Imported By: Sherian Rein 03/15/2010 09:25:55  _____________________________________________________________________  External Attachment:    Type:   Image     Comment:   External Document

## 2010-03-24 NOTE — Assessment & Plan Note (Addendum)
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 She has no  GI complaints at this time. She has seen Dr. Julieta Gutting [facility]) here in [place] and I have talked to him also. She has been tapering prednisone and is ok at 2.5 mg every other day. LFT's stable to better on this.  She asked "Am I supposed to be sick with this (liver disease)?"    GI Review of Systems      Denies abdominal pain, acid reflux, belching, bloating, chest pain, dysphagia with liquids, dysphagia with solids, heartburn, loss of appetite, nausea, vomiting, vomiting blood, weight loss, and  weight gain.      Reports liver problems.     Denies anal fissure, black tarry stools, change in bowel habit, constipation, diarrhea, diverticulosis, fecal incontinence, heme positive stool, hemorrhoids, irritable bowel syndrome, jaundice, light color stool, rectal bleeding, and  rectal pain.    Current Medications (verified): 1)  Caltrate 600 1500 Mg  Tabs (Calcium Carbonate) .... 2  By Mouth Once Daily 2)  Integra 62.5-62.5-40-3 Mg Caps (Fe Fum-Fepoly-Vit C-Vit B3) .... Once Daily 3)  Multivitamins   Tabs (Multiple Vitamin) .Marland Kitchen.. 1 By Mouth Once Daily 4)  Prednisone 5 Mg Tabs (Prednisone) .... Take 1/2 Tablet By Mouth Every Other Day 5)  Ursodiol 300 Mg Caps (Ursodiol) .... Take 1 Capsule By Mouth Three Times A Day 6)  Azathioprine 50 Mg  Tabs (Azathioprine) .... 3 By Mouth Q D 7)  Vitamin D 1000 Unit  Tabs (Cholecalciferol) .... Once Daily 8)  Diflucan 150 Mg Tabs (Fluconazole) .... As Needed  Allergies (verified): 1)  ! * No Live Vaccines  Past History:  Past Medical History: Autoimmune  hepatitis-cholangitis overlap syndrome, sees Dr. Leone Payor, last biopsy 4-09 Skin Rashes, sees Dr. Amy Swaziland Vitamin D deficiency normal DEXA 11-08 Sickle Trait (? when testing performed) Vaccinated for HAV and HBV  Past Surgical History: Reviewed history from 03/11/2009 and no changes required. tubal ligation Tonsillectomy  Family History: Family History of Arthritis Family History Hypertension Family History Kidney disease Family History of Diabetes: Mother Family History of Colon Cancer:Paternal Great Aunt   Social History: Married: 1 son (to start college 8/12) and 1 daughter (in college 2012) and foster children Husband back to work 12/11 Never Smoked Alcohol use-no Drug use-no Occupation: Personnel officer  Review of Systems       skin rash persists but not as bad as in past, mild itching at best  Vital Signs:  Patient profile:   45 year old female Height:      67 inches Weight:      171 pounds BMI:     26.88 BSA:     1.89 Pulse rate:   64 / minute Pulse rhythm:   regular BP sitting:   126 / 72  (left arm) Cuff size:   regular  Vitals Entered By: Ok Anis CMA (March 08, 2010 8:32 AM)  Physical Exam  General:  Well developed, well nourished, no acute distress. Eyes:  anicteric Lungs:  Clear throughout to auscultation. Heart:  Regular rate and rhythm; no murmurs, rubs,  or bruits. Abdomen:  Soft, nontender and nondistended. No masses, hepatosplenomegaly or hernias noted. Normal bowel sounds. Skin:  Some hyperpigmentation of the skin on back and trunk Cervical Nodes:  No significant cervical or supraclavicular adenopathy.  Psych:  Alert and cooperative. Normal mood and affect.   Impression & Recommendations:  Problem # 1:  AUTOIMMUNE HEPATITIS-CHOLANGITIS (ICD-571.42) dx after liver bx (mild CAH and fibrosis)and ERCP [facility] and [facility]) 2006 flex sig negative for UC12/06 4/09 liver bx: CHRONIC MINIMALLY  ACTIVE HEPATITIS. S/P HAV and HBV  vaccinations serologic negative ANA, anti smooth and AMA with normal globulin and IgM LFT's have never normalized though have improved with AZA, prednisone and Urso Dr. Julieta Gutting has concurred with diagnosis after liver biopsy review He has suggested tapering off prednisone (will complete this month), and then tapering of ursodiol due to neewer info that does not support use in [facility] (and she does not have PBC). Then will try to reduce dose of azathioprine. her 1/3/11LFT's were essentially stable with 1 1/2 x elevation of transaminases and alk phos will follow-up again in 1 month and if ok then stop ursodiol she will also see Dr. Julieta Gutting again and his help is appreciated  Orders: TLB-CBC Platelet - w/Differential (85025-CBCD)  Problem # 2:  LONG-TERM USE OF STEROIDS (ICD-V58.65) Assessment: Improved Almost off prednisone I think she has had a DEXA but need to doublecheck - and liver disease is a risk for osteoporosis also  Problem # 3:  LONG-TERM USE OF AZATHIOPRINE (ICD-V58.69)  PAP yearly flu vaccine Pneumovax at Dr. Clent Ridges   Orders: TLB-CBC Platelet - w/Differential (85025-CBCD)  Other Orders: Admin 1st Vaccine (16109) Flu Vaccine 48yrs + (60454)  Patient Instructions: 1)  Copy sent to : Gershon Crane, MD, Jason Coop, MD 2)  CBC today 3)  LFTs in 1 month 4)  Rx. for your Ursodiol printed and given to patient for mail order. 5)  Rx. for Azathioprine printed and given to patient for mail order for 3 months only. 6)  Change Prednisone to taking 1/2 tablet by mouth every 3rd day until 03/22/10 then stop. 7)  Flu shot will be given today. 8)  Please have your annual pap smear/schedule. 9)  You can check on your Pneumo Vaccine with Dr. Clent Ridges and have there if it is time. 10)  The medication list was reviewed and reconciled.  All changed / newly prescribed medications were explained.  A complete medication list was provided to the patient / caregiver. Prescriptions: AZATHIOPRINE 50 MG   TABS (AZATHIOPRINE) 3 by mouth q d  #270 x 0   Entered by:   Milford Cage NCMA   Authorized by:   [name] MD, [facility]   Signed by:   Milford Cage NCMA on 03/08/2010   Method used:   Print then Give to Patient   RxID:   [id number] URSODIOL 300 MG CAPS (URSODIOL) Take 1 capsule by mouth three times a day  #270 x 3   Entered by:   Milford Cage NCMA   Authorized by:   [name] MD, [facility]   Signed by:   Milford Cage NCMA on 03/08/2010   Method used:   Print then Give to Patient   RxID:   [id number]    Immunizations Administered:  Influenza Vaccine # 1:    Vaccine Type: Fluvirin     Site: left deltoid    Mfr: Novartis    Dose: 0.5 ml    Route: IM    Given by: Ok Anis CMA    Exp. Date: 07/2010    Lot #: [id number]    VIS  given: 09/14/09 version given March 08, 2010.  Flu Vaccine Consent Questions:    Do you have a history of severe allergic reactions to this vaccine? no    Any prior history of allergic reactions to egg and/or gelatin? no    Do you have a sensitivity to the preservative Thimersol? no    Do you have a past history of Guillan-Barre Syndrome? no    Do you currently have an acute febrile illness? no    Have you ever had a severe reaction to latex? no    Vaccine information given and explained to patient? yes    Are you currently pregnant? no    Patient: [name] Short Note: All result statuses are Final unless otherwise noted.  Tests: (1) Hepatic/Liver Function Panel (HEPATIC)   Total Bilirubin           1.0 mg/dL                   9.8-1.1   Direct Bilirubin          0.2 mg/dL                   9.1-4.7   Alkaline Phosphatase [H]  161 U/L                     39-117   AST                  [H]  57 U/L                      0-37   ALT                  [H]  54 U/L                      0-35   Total Protein             7.0 g/dL                    8.2-9.5   Albumin                   3.7 g/dL                    6.2-1.3  Note: An exclamation mark  (!) indicates a result that was not dispersed into the flowsheet. Document Creation Date: 02/22/2010 3:24 PM Patient: [facility] Note: All result statuses are Final unless otherwise noted.  Tests: (1) CBC Platelet w/Diff (CBCD)   White Cell Count          6.3 K/uL                    4.5-10.5   Red Cell Count            4.65 Mil/uL                 3.87-5.11   Hemoglobin                13.5 g/dL                   08.6-57.8   Hematocrit                39.7 %                      36.0-46.0  MCV                       85.3 fl                     78.0-100.0   MCHC                      34.0 g/dL                   82.9-56.2   RDW                  [H]  15.9 %                      11.5-14.6   Platelet Count            253.0 K/uL                  150.0-400.0   Neutrophil %              72.2 %                      43.0-77.0   Lymphocyte %              22.0 %                      12.0-46.0   Monocyte %                4.9 %                       3.0-12.0   Eosinophils%              0.6 %                       0.0-5.0   Basophils %               0.3 %                       0.0-3.0   Neutrophill Absolute      4.5 K/uL                    1.4-7.7   Lymphocyte Absolute       1.4 K/uL                    0.7-4.0   Monocyte Absolute         0.3 K/uL                    0.1-1.0  Eosinophils, Absolute                             0.0 K/uL                    0.0-0.7   Basophils Absolute        0.0 K/uL                    0.0-0.1  Note: An exclamation mark (!) indicates a result that was not dispersed into the flowsheet. Document Creation Date: 03/08/2010 11:10 AM ____________________________________________________  Appended Document: f/u rev/cb   Bone Density  Procedure date:  01/10/2007  Findings:      Lumbar Spine:  T Score > -1.0 Spine.  Hip Total: T Score > -1.0 Hip.    Comments:      Assessment:  Normal.

## 2010-03-24 NOTE — Progress Notes (Signed)
Summary: [facility]   Imported By: Sherian Rein 03/15/2010 09:42:54  _____________________________________________________________________  External Attachment:    Type:   Image     Comment:   External Document

## 2010-03-24 NOTE — Progress Notes (Signed)
Summary: [facility]   Imported By: Sherian Rein 03/15/2010 09:40:32  _____________________________________________________________________  External Attachment:    Type:   Image     Comment:   External Document

## 2010-03-24 NOTE — Progress Notes (Signed)
Summary: [facility]   Imported By: Sherian Rein 03/15/2010 09:32:57  _____________________________________________________________________  External Attachment:    Type:   Image     Comment:   External Document

## 2010-03-24 NOTE — Procedures (Signed)
Summary: ERCP/[place]  ERCP/   Imported By: Sherian Rein 03/15/2010 09:28:13  _____________________________________________________________________  External Attachment:    Type:   Image     Comment:   External Document

## 2010-03-24 NOTE — Progress Notes (Signed)
Summary: [facility]   Imported By: Sherian Rein 03/15/2010 09:38:53  _____________________________________________________________________  External Attachment:    Type:   Image     Comment:   External Document

## 2010-03-30 NOTE — Progress Notes (Signed)
Summary: DEXA   Phone Note Call from Patient Call back at Home Phone [phone]   Caller: Patient Call For: Dr Leone Payor Reason for Call: Talk to Nurse, Talk to Doctor Details for Reason: DEXA Summary of Call: Pt walked in very upset because xray stated her insurance will only pay for a DEXA every two years and according to xray, she had one April 2011. She wanted to know why Dr Leone Payor would order a test that she would have to pay for out of pocket. I explained to the pt (per order) that we do not have record of a DEXA done last year, that the last one we ordered was in 2008. Pt insisted that I was wrong and wanted to know if she should still have the test done. I advised that we would send a note to the nurse for Dr Marvell Fuller recommendations as to if it is medically necessary for her to have a repeat DEXA within one year. Initial call taken by: Dwan Bolt,  March 22, 2010 2:33 PM  Follow-up for Phone Call        Tried to call patient but no answer. Checked with Belenda Cruise ? in radiology and she stated patient came back to her department. There are 2 patients with the same name and rad. pulled up the wrong patient. Patient was r/s for this Thursday. Graciella Freer RN  March 22, 2010 4:29 PM    Spoke with patient this am who will have her scan later this week. Follow-up by: Graciella Freer RN,  March 23, 2010 8:17 AM

## 2010-03-30 NOTE — Miscellaneous (Signed)
Summary: BONE DENSITY  Clinical Lists Changes  Orders: Added new Test order of T-Lumbar Vertebral Assessment (77082) - Signed 

## 2010-04-06 ENCOUNTER — Telehealth (INDEPENDENT_AMBULATORY_CARE_PROVIDER_SITE_OTHER): Payer: Self-pay

## 2010-04-08 ENCOUNTER — Ambulatory Visit (INDEPENDENT_AMBULATORY_CARE_PROVIDER_SITE_OTHER)
Admission: RE | Admit: 2010-04-08 | Discharge: 2010-04-08 | Disposition: A | Discharge status: Home / Self Care | Payer: Managed Care, Other (non HMO) | Source: Ambulatory Visit | Attending: Internal Medicine | Admitting: Internal Medicine

## 2010-04-08 ENCOUNTER — Other Ambulatory Visit: Payer: Self-pay | Admitting: Internal Medicine

## 2010-04-08 ENCOUNTER — Encounter: Payer: Self-pay | Admitting: Internal Medicine

## 2010-04-08 ENCOUNTER — Other Ambulatory Visit: Payer: Self-pay

## 2010-04-08 DIAGNOSIS — IMO0002 Reserved for concepts with insufficient information to code with codable children: Secondary | ICD-10-CM

## 2010-04-08 DIAGNOSIS — K754 Autoimmune hepatitis: Secondary | ICD-10-CM

## 2010-04-13 ENCOUNTER — Telehealth: Payer: Self-pay | Admitting: Internal Medicine

## 2010-04-13 NOTE — Progress Notes (Signed)
Summary: Reschedule Dexa   Phone Note Outgoing Call Call back at [facility] Phone [phone]   Call placed by: Darcey Nora RN, CGRN,  April 06, 2010 12:12 PM Call placed to: Patient Summary of Call: patient is rescehdule for DEXA scan for 04/08/10 2:30 Initial call taken by: Darcey Nora RN, CGRN,  April 06, 2010 12:12 PM    Prescriptions: URSODIOL 300 MG CAPS (URSODIOL) Take 1 capsule by mouth three times a day  #270 x 3   Entered by:   Darcey Nora RN, CGRN   Authorized by:   [name] MD, [facility]   Signed by:   Darcey Nora RN, CGRN on 04/06/2010   Method used:   Printed then faxed to ...       [facility]  [address]  [phone]* (retail)       [address]       [place], [place]  [address]       Ph: [phone] or [phone]       Fax: [phone]   RxID:   [id number]

## 2010-04-19 NOTE — Progress Notes (Signed)
Summary: reminder to have LFTs   ---- Converted from flag ---- ---- 03/08/2010 9:11 AM, Milford Cage NCMA wrote: check and see if patient had lfts ------------------------------  Phone Note Outgoing Call   Call placed by: Milford Cage NCMA,  April 13, 2010 9:43 AM Call placed to: Patient Summary of Call: Called patient and reminded her that it is time for her to have repeat LFTs.   Initial call taken by: Milford Cage NCMA,  April 13, 2010 9:43 AM

## 2010-04-21 ENCOUNTER — Encounter (INDEPENDENT_AMBULATORY_CARE_PROVIDER_SITE_OTHER): Payer: Self-pay | Admitting: *Deleted

## 2010-04-21 ENCOUNTER — Other Ambulatory Visit: Payer: Self-pay | Admitting: Internal Medicine

## 2010-04-21 ENCOUNTER — Other Ambulatory Visit: Payer: Managed Care, Other (non HMO)

## 2010-04-21 DIAGNOSIS — K7030 Alcoholic cirrhosis of liver without ascites: Secondary | ICD-10-CM

## 2010-04-21 LAB — HEPATIC FUNCTION PANEL
ALT: 37 U/L — ABNORMAL HIGH (ref 0–35)
AST: 41 U/L — ABNORMAL HIGH (ref 0–37)
Alkaline Phosphatase: 157 U/L — ABNORMAL HIGH (ref 39–117)
Bilirubin, Direct: 0.2 mg/dL (ref 0.0–0.3)
Total Bilirubin: 0.7 mg/dL (ref 0.3–1.2)

## 2010-05-31 ENCOUNTER — Other Ambulatory Visit: Payer: Self-pay | Admitting: Internal Medicine

## 2010-05-31 ENCOUNTER — Other Ambulatory Visit: Payer: Self-pay | Admitting: *Deleted

## 2010-05-31 MED ORDER — URSODIOL 300 MG PO CAPS: 300.0000 mg | ORAL_CAPSULE | Freq: Three times a day (TID) | ORAL | Status: DC

## 2010-06-03 NOTE — Telephone Encounter (Signed)
Sent to nurse

## 2010-06-13 ENCOUNTER — Ambulatory Visit: Payer: Managed Care, Other (non HMO) | Admitting: Gastroenterology

## 2010-06-21 ENCOUNTER — Other Ambulatory Visit: Payer: Self-pay | Admitting: Internal Medicine

## 2010-06-21 ENCOUNTER — Other Ambulatory Visit (INDEPENDENT_AMBULATORY_CARE_PROVIDER_SITE_OTHER): Payer: Managed Care, Other (non HMO)

## 2010-06-21 LAB — HEPATIC FUNCTION PANEL
ALT: 33 U/L (ref 0–35)
AST: 41 U/L — ABNORMAL HIGH (ref 0–37)
Albumin: 3.4 g/dL — ABNORMAL LOW (ref 3.5–5.2)
Total Bilirubin: 1 mg/dL (ref 0.3–1.2)
Total Protein: 6.7 g/dL (ref 6.0–8.3)

## 2010-06-23 ENCOUNTER — Telehealth: Payer: Self-pay

## 2010-06-23 DIAGNOSIS — K754 Autoimmune hepatitis: Secondary | ICD-10-CM

## 2010-06-23 MED ORDER — AZATHIOPRINE 50 MG PO TABS: ORAL_TABLET | ORAL | Status: DC

## 2010-06-23 NOTE — Telephone Encounter (Signed)
Message copied by Darcey Nora on Thu Jun 23, 2010 11:28 AM ------      Message from: Stan Head      Created: Thu Jun 23, 2010  7:52 AM       Labs stable ok      Stop ursodiol do not think have done yet.       Repeat lfts in 4-6 weeks then and will see about reducing azathioprine

## 2010-06-23 NOTE — Progress Notes (Signed)
Quick Note:  Labs stable ok Stop ursodiol do not think have done yet.  Repeat lfts in 4-6 weeks then and will see about reducing azathioprine ______

## 2010-06-23 NOTE — Telephone Encounter (Signed)
Patient advised of Dr Marvell Fuller recommendations.  She will call back for any questions.  New lab orders entered for June

## 2010-07-05 NOTE — Assessment & Plan Note (Signed)
[facility]                         GASTROENTEROLOGY OFFICE NOTE   NAME:Hayner, RAINELLE SULEWSKI                      MRN:          [id number]  DATE:01/10/2007                            DOB:          September 11, 1965    CHIEF COMPLAINT:  Follow up of autoimmune hepatitis/cholangitis and  overlap syndrome.   Last seen March, 2008.   Jaleea feels like she is doing well though she is still itching and has  this rash.  She never followed up with Dr. Swaziland; she has some  insurance issues and has had some topical therapies that did not seem to  help.  Dr. Elvis Coil note to me indicated she would perform a biopsy if  this persisted.   Work is going well.  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  She is able to get her medicines.  She is not  having any significant abdominal pain or bowel symptoms at this time.   Laboratory studies over time have shown improvement.  Her bilirubin was  down to 1.5, alkaline phosphatase 148, AST 50, ALT 58 on 01/01/07.  CBC was normal.   Dr. Clent Ridges had wondered about putting her on Zetia because she had some  elevated lipids.  The lipids that we had drawn on 11/11 showed an LDL of  108, VLDL 15, HDL 53, triglycerides 76 and a cholesterol of 176.  I  think they were higher previously.   CURRENT MEDICATIONS:  1. Prednisone 5 mg daily.  2. Multivitamin daily.  3. Caltrate 600 + D daily.  4. Iron pill daily.  5. Prilosec daily.  6. Urso 300 mg t.i.d.  7. Azathioprine 100 mg daily.  8. Clarinex p.r.n.   PAST MEDICAL HISTORY:  1. Autoimmune hepatitis and cholangitis overlap syndrome.  2. Vitamin D deficiency status post treatment.  3. History of hyperglycemia on prednisone.  4. Chronic high risk medications on azathioprine with normal TP and P      phenotype.  5. Chronic prednisone therapy.  6. Skin rash, unclear etiology.  7. Negative  flexible sigmoidoscopy.  8. Normal bone density study in the past.  9. Status post vaccination hepatitis A and hepatitis B.  10.Allergic rhinosinusitis.  11.History of vaginal candidiasis resolved.   PHYSICAL EXAMINATION:  She is in no acute distress.  Weight 175 pounds,  pulse 72, blood pressure 118/70.  The eyes are anicteric.  Neck is supple, no thyromegaly, mass or  nodules.  LUNGS:  Clear.  HEART:  S1, S2, no abnormal sounds.  ABDOMEN:  Soft, nontender, no organomegaly or mass.  SKIN:  Has a diffuse hyperpigmented punctate type of rash with minor  elevation, not really papular but it is almost papular. There are a few  excoriations.  This is rather diffuse on the trunk.  NEURO:  She is alert and oriented x3.   ASSESSMENT:  1. Autoimmune hepatitis and cholangitis overlap syndrome improved on      her therapies.  2. Skin rash.  3. History of elevated cholesterol.   PLAN:  1.  Flu shot today. She will go to Dr. Clent Ridges for Pneumovax.  2. Followup DEXA scan.  3. Vitamin D supplementation with 1000 units daily.  4. Increase azathioprine to 150 mg daily.  5. Decrease prednisone to 2.5 mg daily.  6. Recheck hepatic function panel, CBC and check a vitamin D level in      2 weeks (repeat labs due to change in azathioprine dose).  7. She is to call Dr. Swaziland for followup appointment regarding the      skin rash and possible biopsy per Dr. Swaziland.  8. I will need to see her back in several months, we will arrange her      lab followup.  9. I need to consider a repeat liver biopsy at some point I believe.   Regarding the Zetia therapy - the labs we have now look like she does  not need it.  Will defer to Dr. Clent Ridges. I do not think that would be  contraindicated based upon her liver disease but based upon these  numbers, I think I would hold off on that right now.     [name], MD,FACG  Electronically Signed    CEG/MedQ  DD: 01/12/2007  DT: 01/12/2007  Job #: [id number]   cc:    Jeannett Senior A. Clent Ridges, MD  Amy Y. Swaziland, M.D.

## 2010-07-05 NOTE — Assessment & Plan Note (Signed)
[facility]                         GASTROENTEROLOGY OFFICE NOTE   NAME:Mccorkel, MAKAYAH PAULI                      MRN:          [id number]  DATE:04/02/2007                            DOB:          [birth year]    CHIEF COMPLAINT:  Follow-up of autoimmune hepatitis.   In the interim I had her do a DEXA scan and that was okay.  Her LFT's  have stabilized with an alkaline phosphatase of 168, AST 38, ALT 37,  albumin 3.4.  CBC normal on the azathioprine.  She was supposed to cut  her prednisone to 2.5 mg a day, but she is still on 5 mg a day.  Her  rash is less pruritic, though still present.  She is working on getting  an appointment with Amy Y. Swaziland, M.D.  Her vitamin D level is 49 and  is stable.  She is taking 1000 units a day.  She has had no fever,  chills, or other constitutional symptoms.   MEDICATIONS:  Listed and reviewed in the chart.   PAST MEDICAL HISTORY:  Reviewed and unchanged from January 10, 2007.   PHYSICAL EXAMINATION:  GENERAL:  No acute distress.  VITAL SIGNS:  Weight 178 pounds, relatively stable, pulse 76 and  regular, blood pressure 104/62.  HEENT:  Eyes are anicteric, somewhat muddy sclerae.  NECK:  Supple.  CHEST:  Clear.  HEART:  S1 and S2 with no murmurs, rubs, or gallops.  ABDOMEN:  Soft and nontender without hepatosplenomegaly or mass.  EXTREMITIES:  Free of edema.  SKIN:  Mainly on the upper trunk has a very pinpoint hyperpigmented  rash.  I think it is less prominent than in the November of 2008 period.  There were a few pustules.   ASSESSMENT:  1. Autoimmune hepatitis and cholangitis overlap syndrome, improved,      tolerating increased dose of azathioprine.  2. Skin rash of unclear etiology.   PLAN:  1. Continue current medications, but decrease prednisone to 2.5 mg      daily.  2. Repeat hepatic function panel and CBC level in one month.  3. If she tolerates reducing the prednisone to 2.5 mg daily, we may be     able to go to every other day.  Her LFT's are not entirely normal,      however, I am not sure they ever will be given previous liver      biopsy.  We need to think about another liver biopsy for follow-up.      It has been almost three years and depending upon how she does or      does not respond to the prednisone, we will plan on one in the next      few months and I will let her know.  I will see her back within six      months on a routine basis, i.e., six months at the earliest, I      think      routinely and we will try to follow along with the LFT's and make      other determinations based  upon that.     [name], MD,FACG  Electronically Signed    CEG/MedQ  DD: 04/02/2007  DT: 04/03/2007  Job #: [id number]   cc:   Jeannett Senior A. Clent Ridges, MD  Amy Y. Swaziland, M.D.

## 2010-07-08 NOTE — Assessment & Plan Note (Signed)
[facility]                         GASTROENTEROLOGY OFFICE NOTE   NAME:[name] Short, [name] Short                      MRN:          [id number]  DATE:05/21/2006                            DOB:          19-Mar-1965    CHIEF COMPLAINT:  Followup of autoimmune hepatitis/cholangitis.   HISTORY OF PRESENT ILLNESS:  [name] Short's transaminases and alkaline  phosphatase were climbing. I have started her on 10 mg of prednisone,  which she took for a few weeks and then stopped. For about 2 months, it  turns out that she has been out of her azathioprine and her Actigall.  She did not tell us that she was not able to afford her medications  until recently. I talked to her about this today adn the importance to  maintain her medications and to apply. She knows this but I think she  just did not want to tell us. I was able to obtain samples of  azathioprine 100 mg daily for her. 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 Her  alkaline phosphatase was 261, AST 122, ALT 110 on May 07, 2006. Weight  is 166 now. Blood pressure 108/74. Abdomen is soft and non-tender  without hepatosplenomegaly or mass. She really feels well other than  some soreness in the right shoulder.   ASSESSMENT:  Autoimmune hepatitis and cholangitis. Non-compliance with  therapy with rise in transaminases and alkaline phosphatase.   RECOMMENDATIONS:  1. Restart azathioprine 100 mg daily.  2. Prednisone 10 mg daily.  3. She also does complain of a yeast infection in the vaginal area      related to the prednisone, so Diflucan 150 mg daily was prescribed.      She can get that and her prednisone at [facility] for $4.00. Once she      has a CBC and hepatitic function panel in a month, I will call her      with the results and further plans. Hopefully, she will not need a      prior dose of prednisone but that is possible. It would be nice for      her to  get back on Actigall again, when she can. That seemed to      help her, though I think the azathioprine plus/minus the prednisone      are the most important medications here.     [name], MD,FACG  Electronically Signed    CEG/MedQ  DD: 05/21/2006  DT: 05/21/2006  Job #: [id number]   cc:   Jeannett Senior A. Clent Ridges, MD

## 2010-07-08 NOTE — Assessment & Plan Note (Signed)
[facility]                           GASTROENTEROLOGY OFFICE NOTE   NAME:Short, [name] REACH                      MRN:          [id number]  DATE:09/12/2005                            DOB:          1965-06-20   See my medical history and physical form in the [facility] chart for full  details.   REASON FOR THIS VISIT:  Followup of autoimmune hepatitis, cholangitis  overlap and anemia.   OTHER PROBLEMS:  1.  Vitamin D deficiency status post treatment.  2.  Hyperglycemia on prednisone.  3.  Chronic high-risk medications on __________ with normal TPMT genotype.      Also on prednisone.  4.  Skin rash, unclear etiology.  Dermatology appointment September 14, 2005      pending.  5.  Unrevealing flexible sigmoidoscopy.  6.  Normal bone densitometry study.  7.  _________ hepatitis A and hepatitis B (to start vaccination today).  8.  Allergic rhinosinusitis.   ASSESSMENT:  1.  Autoimmune hepatitis cholangitis.  Things have been relatively stable.      Her LFT's have been in the 1-2 times abnormal range, as far as alkaline      phosphatase.  2.  She still has pruritus.  She still has a skin rash.  The pruritus seems      localized to the areas of the rash.  Question if there is a cause versus      effect phenomenon here.  3.  Mild anemia.  She was started on iron to see what effect that would      have.   PLAN:  1.  Hepatitis A and B vaccination to start today.  2.  Recheck CBC and CMET.  Further plans pending that.  3.  Need to consider a repeat liver biopsy at some point, I think.  4.  She is getting vitamin D with her Caltrate.  I confirmed that.  5.  Continue Prilosec for dyspepsia.  That is working well.  6.  Continue other medications and monitor her colds and viruses.  She had a      viral gastroenteritis, it sounds like.  She has had a few colds, though      nothing has persisted long term.  I am not overtly suspicious of      problems due to her  immunosuppression in these      illnesses.  7.  See will see me again in 3 months; sooner if needed.                                   [name], MD, Clementeen Graham  CEG/MedQ  DD:  09/12/2005  DT:  09/12/2005 Job #:  [id number]   cc:   Jeannett Senior A. Clent Ridges, MD  Amy Y. Swaziland, MD

## 2010-07-08 NOTE — Assessment & Plan Note (Signed)
[facility]                         GASTROENTEROLOGY OFFICE NOTE   NAME:Short, [name] Short                      MRN:          [id number]  DATE:03/13/2006                            DOB:          [birth year]    CHIEF COMPLAINT:  Follow up of autoimmune hepatitis/cholangitis overlap  and anemia.  Additional problems include vitamin D deficiency, status  post treatment.  She has had hyperglycemia when on prednisone.  Chronic  high risk medications:  She is on azathioprine with normal TPMT  genotype.  She has been on prednisone, now discontinued.  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  She has been  evaluated and treated by Dr. Swaziland.  She has had a normal flexible  sigmoidoscopy and normal bone densitometry study.  She is currently  being vaccinated for hepatitis A and B and she has allergic  rhinosinusitis.   She has done well without significant problems.  She has changed jobs  and is now without insurance.  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  She is due for her  second hepatitis A vaccine and her third and final hepatitis B vaccine  today.  She was last evaluated in July, at which point she had labs  showing normalization of her LFTs except for mild elevation of alkaline  phosphatase and I stopped her prednisone at that time.  She has  occasional pruritus with the skin rash.  She has a need to follow up  with dermatology and has not yet done so.   She did get a flu shot this year.   PHYSICAL EXAMINATION:  A well-developed, well-nourished black woman.  Weight 169 pounds.  Blood pressure 112/70, pulse 60. Her weight is down  from 175 in July.  Her eyes are anicteric.  LUNGS:  Clear.  HEART:  S1, S2.  ABDOMEN:  Is soft, nontender without hepatosplenomegaly or mass.  SKIN:  Shows this diffuse  papular rash with hyperpigmentation around the  hair follicle area.  She is alert and oriented x3.   ASSESSMENT:  Autoimmune cholangitis and hepatitis.  Clinically appears  stable; in need of lab work to assess that as well as follow up on her  chronic immunomodulator therapy.  We will check a CMET, CBC today.  She  has been given her vaccinations to complete her hepatitis A and B  series.  Follow up to be arranged.  She is advised that she must keep  her lab follow up at least every 3 months depending upon what these labs  show and we will arrange office visit  depending upon the results of the labs and her clinical course.  She  plans to follow up with Dr. Swaziland at some point.   Note:  Her medications are listed and reviewed in the chart.     [name] Boop, MD,FACG  Electronically Signed    CEG/MedQ  DD: 03/14/2006  DT: 03/14/2006  Job #: [id number]

## 2010-11-15 LAB — CBC
Hemoglobin: 13.4
MCHC: 33.9
Platelets: 240
RDW: 15.2

## 2010-11-15 LAB — PROTIME-INR
INR: 1
Prothrombin Time: 13

## 2010-12-02 ENCOUNTER — Other Ambulatory Visit: Payer: Self-pay

## 2010-12-02 ENCOUNTER — Telehealth: Payer: Self-pay | Admitting: Internal Medicine

## 2010-12-02 DIAGNOSIS — K754 Autoimmune hepatitis: Secondary | ICD-10-CM

## 2010-12-02 MED ORDER — AZATHIOPRINE 50 MG PO TABS: ORAL_TABLET | ORAL | Status: DC

## 2010-12-02 NOTE — Telephone Encounter (Signed)
Patient [facility]  [facility]

## 2010-12-10 ENCOUNTER — Other Ambulatory Visit: Payer: Self-pay | Admitting: Internal Medicine

## 2011-05-08 ENCOUNTER — Telehealth: Payer: Self-pay | Admitting: Internal Medicine

## 2011-05-08 DIAGNOSIS — K754 Autoimmune hepatitis: Secondary | ICD-10-CM

## 2011-05-08 NOTE — Telephone Encounter (Signed)
No answer

## 2011-05-08 NOTE — Telephone Encounter (Signed)
Attempted to reach the patient no answer.  She is due for labs.  I have placed an order I will continue to try and reach the patient

## 2011-05-09 NOTE — Telephone Encounter (Signed)
Patient is scheduled for REV for 06/02/11.  She will come for labs one day this week

## 2011-05-24 ENCOUNTER — Other Ambulatory Visit (INDEPENDENT_AMBULATORY_CARE_PROVIDER_SITE_OTHER): Payer: Managed Care, Other (non HMO)

## 2011-05-24 DIAGNOSIS — K754 Autoimmune hepatitis: Secondary | ICD-10-CM

## 2011-05-24 LAB — CBC WITH DIFFERENTIAL/PLATELET
Basophils Relative: 0.4 % (ref 0.0–3.0)
Eosinophils Relative: 1.7 % (ref 0.0–5.0)
Hemoglobin: 12.9 g/dL (ref 12.0–15.0)
Lymphocytes Relative: 24.2 % (ref 12.0–46.0)
MCHC: 32.5 g/dL (ref 30.0–36.0)
Monocytes Relative: 7.3 % (ref 3.0–12.0)
Neutro Abs: 3.9 10*3/uL (ref 1.4–7.7)
Neutrophils Relative %: 66.4 % (ref 43.0–77.0)
RBC: 4.58 Mil/uL (ref 3.87–5.11)
WBC: 5.9 10*3/uL (ref 4.5–10.5)

## 2011-05-24 LAB — HEPATIC FUNCTION PANEL
ALT: 71 U/L — ABNORMAL HIGH (ref 0–35)
AST: 111 U/L — ABNORMAL HIGH (ref 0–37)
Albumin: 3.6 g/dL (ref 3.5–5.2)
Alkaline Phosphatase: 281 U/L — ABNORMAL HIGH (ref 39–117)
Bilirubin, Direct: 0.2 mg/dL (ref 0.0–0.3)
Total Protein: 7 g/dL (ref 6.0–8.3)

## 2011-05-26 NOTE — Progress Notes (Signed)
Quick Note:  Will discuss at rev 4/12 ______

## 2011-06-02 ENCOUNTER — Ambulatory Visit (INDEPENDENT_AMBULATORY_CARE_PROVIDER_SITE_OTHER): Payer: Managed Care, Other (non HMO) | Admitting: Internal Medicine

## 2011-06-02 ENCOUNTER — Encounter: Payer: Self-pay | Admitting: Internal Medicine

## 2011-06-02 VITALS — BP 108/70 | HR 84 | Ht 66.0 in | Wt 182.0 lb

## 2011-06-02 DIAGNOSIS — K8301 Primary sclerosing cholangitis: Secondary | ICD-10-CM

## 2011-06-02 DIAGNOSIS — K754 Autoimmune hepatitis: Secondary | ICD-10-CM

## 2011-06-02 DIAGNOSIS — K8309 Other cholangitis: Secondary | ICD-10-CM

## 2011-06-02 DIAGNOSIS — Z7960 Long term (current) use of unspecified immunomodulators and immunosuppressants: Secondary | ICD-10-CM

## 2011-06-02 DIAGNOSIS — R21 Rash and other nonspecific skin eruption: Secondary | ICD-10-CM

## 2011-06-02 DIAGNOSIS — Z79899 Other long term (current) drug therapy: Secondary | ICD-10-CM

## 2011-06-02 NOTE — Assessment & Plan Note (Signed)
No symptoms but alk phos and transaminases are increased. She is only on Azathioprine at 150 mg daily at this time. Ursodeoxycholic acid and prednisone stopped within last year. Will recheck LFT's in 1 month. May need repeat liver biopsy as per Dr. Luisa Dago recommendations [facility] Liver) in 2011. Though not sure we can say she is flaring.

## 2011-06-02 NOTE — Progress Notes (Signed)
  Subjective:    Patient ID: [name] Short, female    DOB: 04/26/1965, 46 y.o.   MRN: [id number]  HPI Patient returns having been seen about a year ago. She has primary sclerosing cholangitis and autoimmune hepatitis overlapped. She is now off ursodeoxycholic acid and prednisone. She reports feeling well. No diarrhea. No abdominal pain. No pruritus.  Her husband has been in the hospital again with his heart. 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 Her son and daughter are both in college. She no longer has foster children but has adopted a child.   Review of Systems Some itching of the eyes associated with allergies    Objective:   Physical Exam General:  NAD Eyes:   anicteric Lungs:  clear Heart:  S1S2 no rubs, murmurs or gallops Abdomen:  soft and nontender, BS+ no hepatosplenomegaly or mass Ext:   no edema Skin:  Fine pinpoint papular rash on the upper abdomen as previously   Data Reviewed:   Lab Results  Component Value Date   WBC 5.9 05/24/2011   HGB 12.9 05/24/2011   HCT 39.7 05/24/2011   MCV 86.6 05/24/2011   PLT 225.0 05/24/2011     Chemistry      Component Value Date/Time   NA 143 03/11/2009 1607   K 3.7 03/11/2009 1607   CL 104 03/11/2009 1607   CO2 30 03/11/2009 1607   BUN 9 03/11/2009 1607   CREATININE 0.7 03/11/2009 1607      Component Value Date/Time   CALCIUM 9.9 03/11/2009 1607   ALKPHOS 281* 05/24/2011 1328   AST 111* 05/24/2011 1328   ALT 71* 05/24/2011 1328   BILITOT 0.9 05/24/2011 1328            Assessment & Plan:

## 2011-06-02 NOTE — Patient Instructions (Addendum)
Please return for lab work the first week of May 2013.  You may go straight to the basement for these.  Call us when you need a refill on your azathioprine.

## 2011-06-20 ENCOUNTER — Other Ambulatory Visit (INDEPENDENT_AMBULATORY_CARE_PROVIDER_SITE_OTHER): Payer: Managed Care, Other (non HMO)

## 2011-06-20 DIAGNOSIS — Z0000 Encounter for general adult medical examination without abnormal findings: Secondary | ICD-10-CM

## 2011-06-20 LAB — BASIC METABOLIC PANEL
BUN: 9 mg/dL (ref 6–23)
Calcium: 9.2 mg/dL (ref 8.4–10.5)
Creatinine, Ser: 0.8 mg/dL (ref 0.4–1.2)

## 2011-06-20 LAB — CBC WITH DIFFERENTIAL/PLATELET
Basophils Relative: 0.3 % (ref 0.0–3.0)
Eosinophils Absolute: 0.1 10*3/uL (ref 0.0–0.7)
Eosinophils Relative: 1.6 % (ref 0.0–5.0)
Lymphocytes Relative: 26.3 % (ref 12.0–46.0)
MCV: 87 fl (ref 78.0–100.0)
Monocytes Absolute: 0.3 10*3/uL (ref 0.1–1.0)
Neutrophils Relative %: 66 % (ref 43.0–77.0)
Platelets: 227 10*3/uL (ref 150.0–400.0)
RBC: 4.78 Mil/uL (ref 3.87–5.11)
WBC: 5.9 10*3/uL (ref 4.5–10.5)

## 2011-06-20 LAB — HEPATIC FUNCTION PANEL
ALT: 88 U/L — ABNORMAL HIGH (ref 0–35)
Alkaline Phosphatase: 387 U/L — ABNORMAL HIGH (ref 39–117)
Bilirubin, Direct: 0.2 mg/dL (ref 0.0–0.3)
Total Bilirubin: 1.4 mg/dL — ABNORMAL HIGH (ref 0.3–1.2)
Total Protein: 7.1 g/dL (ref 6.0–8.3)

## 2011-06-20 LAB — LIPID PANEL
LDL Cholesterol: 104 mg/dL — ABNORMAL HIGH (ref 0–99)
VLDL: 18.2 mg/dL (ref 0.0–40.0)

## 2011-06-23 ENCOUNTER — Encounter: Payer: Self-pay | Admitting: Family Medicine

## 2011-06-23 NOTE — Progress Notes (Signed)
Quick Note:  I tried to reach pt by phone, no answer. I put a copy of results in mail. ______ 

## 2011-07-03 ENCOUNTER — Other Ambulatory Visit (HOSPITAL_COMMUNITY)
Admission: RE | Admit: 2011-07-03 | Discharge: 2011-07-03 | Disposition: A | Discharge status: Home / Self Care | Payer: Managed Care, Other (non HMO) | Source: Ambulatory Visit | Attending: Family Medicine | Admitting: Family Medicine

## 2011-07-03 ENCOUNTER — Encounter: Payer: Self-pay | Admitting: Family Medicine

## 2011-07-03 ENCOUNTER — Ambulatory Visit (INDEPENDENT_AMBULATORY_CARE_PROVIDER_SITE_OTHER): Payer: Managed Care, Other (non HMO) | Admitting: Family Medicine

## 2011-07-03 VITALS — BP 110/74 | HR 82 | Temp 98.7°F | Ht 66.0 in | Wt 176.0 lb

## 2011-07-03 DIAGNOSIS — Z01419 Encounter for gynecological examination (general) (routine) without abnormal findings: Secondary | ICD-10-CM | POA: Insufficient documentation

## 2011-07-03 DIAGNOSIS — Z0000 Encounter for general adult medical examination without abnormal findings: Secondary | ICD-10-CM

## 2011-07-03 LAB — POCT URINALYSIS DIPSTICK
Ketones, UA: NEGATIVE
Protein, UA: NEGATIVE
Spec Grav, UA: 1.015
Urobilinogen, UA: 0.2
pH, UA: 5.5

## 2011-07-03 NOTE — Progress Notes (Signed)
Addended by: Aniceto Boss A on: 07/03/2011 10:52 AM   Modules accepted: Orders

## 2011-07-03 NOTE — Progress Notes (Signed)
Subjective:    Patient ID: [name] Short, female    DOB: 01/21/1966, 46 y.o.   MRN: [id number]  HPI 46 yr old female for a cpx. She feels fine with no complaints. She is seeing Dr. Leone Payor about her hepatitis, and her recent liver enzymes have been elevated. They are considering getting another biopsy soon. She has not had a mammogram for several years.    Review of Systems  Constitutional: Negative.  Negative for fever, diaphoresis, activity change, appetite change, fatigue and unexpected weight change.  HENT: Negative.  Negative for hearing loss, ear pain, nosebleeds, congestion, sore throat, trouble swallowing, neck pain, neck stiffness, voice change and tinnitus.   Eyes: Negative.  Negative for photophobia, pain, discharge, redness and visual disturbance.  Respiratory: Negative.  Negative for apnea, cough, choking, chest tightness, shortness of breath, wheezing and stridor.   Cardiovascular: Negative.  Negative for chest pain, palpitations and leg swelling.  Gastrointestinal: Negative.  Negative for nausea, vomiting, abdominal pain, diarrhea, constipation, blood in stool, abdominal distention and rectal pain.  Genitourinary: Negative.  Negative for dysuria, urgency, frequency, hematuria, flank pain, vaginal bleeding, vaginal discharge, enuresis, difficulty urinating, vaginal pain and menstrual problem.  Musculoskeletal: Negative.  Negative for myalgias, back pain, joint swelling, arthralgias and gait problem.  Skin: Negative.  Negative for color change, pallor, rash and wound.  Neurological: Negative.  Negative for dizziness, tremors, seizures, syncope, speech difficulty, weakness, light-headedness, numbness and headaches.  Hematological: Negative.  Negative for adenopathy. Does not bruise/bleed easily.  Psychiatric/Behavioral: Negative.  Negative for hallucinations, behavioral problems, confusion, sleep disturbance, dysphoric mood and agitation. The patient is not nervous/anxious.       Objective:   Physical Exam  Constitutional: She appears well-developed and well-nourished. No distress.  HENT:  Head: Normocephalic and atraumatic.  Right Ear: External ear normal.  Left Ear: External ear normal.  Nose: Nose normal.  Mouth/Throat: Oropharynx is clear and moist. No oropharyngeal exudate.  Eyes: Conjunctivae and EOM are normal. Pupils are equal, round, and reactive to light. Right eye exhibits no discharge. Left eye exhibits no discharge. No scleral icterus.  Neck: Normal range of motion. Neck supple. No JVD present. No thyromegaly present.  Cardiovascular: Normal rate, regular rhythm, normal heart sounds and intact distal pulses.  Exam reveals no gallop and no friction rub.   No murmur heard. Pulmonary/Chest: Effort normal and breath sounds normal. No stridor. No respiratory distress. She has no wheezes. She has no rales. She exhibits no tenderness.  Abdominal: Soft. Normal appearance and bowel sounds are normal. She exhibits no distension, no abdominal bruit, no ascites and no mass. There is no hepatosplenomegaly. There is no tenderness. There is no rigidity, no rebound and no guarding. No hernia.  Genitourinary: Rectum normal, vagina normal and uterus normal. No breast swelling, tenderness, discharge or bleeding. Cervix exhibits no motion tenderness, no discharge and no friability. Right adnexum displays no mass, no tenderness and no fullness. Left adnexum displays no mass, no tenderness and no fullness. No erythema, tenderness or bleeding around the vagina. No vaginal discharge found.  Musculoskeletal: Normal range of motion. She exhibits no edema and no tenderness.  Lymphadenopathy:    She has no cervical adenopathy.  Neurological: She is alert. She has normal reflexes. No cranial nerve deficit. She exhibits normal muscle tone. Coordination normal.  Skin: Skin is warm and dry. No rash noted. She is not diaphoretic. No erythema. No pallor.  Psychiatric: She has a normal mood  and affect. Her behavior is normal. Judgment  and thought content normal.          Assessment & Plan:  Well exam. Encouraged her to set up a mammogram. Follow up with Dr. Leone Payor

## 2011-07-05 NOTE — Progress Notes (Signed)
Quick Note:  I spoke with pt ______ 

## 2011-08-14 ENCOUNTER — Other Ambulatory Visit: Payer: Self-pay | Admitting: Internal Medicine

## 2011-08-14 MED ORDER — AZATHIOPRINE 50 MG PO TABS: 50.0000 mg | ORAL_TABLET | Freq: Every day | ORAL | Status: DC

## 2011-08-14 NOTE — Telephone Encounter (Signed)
[facility]  Labs were done end of April 2013.

## 2011-09-05 ENCOUNTER — Other Ambulatory Visit: Payer: Self-pay | Admitting: Internal Medicine

## 2011-09-05 MED ORDER — AZATHIOPRINE 50 MG PO TABS: 50.0000 mg | ORAL_TABLET | Freq: Every day | ORAL | Status: DC

## 2011-09-05 NOTE — Telephone Encounter (Signed)
Tried to call pt and inform her Rx sent to mail order Pharmacy.  No answer, rang and rang.

## 2011-12-24 ENCOUNTER — Other Ambulatory Visit: Payer: Self-pay | Admitting: Internal Medicine

## 2011-12-26 ENCOUNTER — Other Ambulatory Visit: Payer: Self-pay

## 2011-12-26 DIAGNOSIS — K754 Autoimmune hepatitis: Secondary | ICD-10-CM

## 2011-12-26 NOTE — Progress Notes (Signed)
Patient advised that she is overdue for labs.  She will come by the end of the week

## 2011-12-28 ENCOUNTER — Other Ambulatory Visit (INDEPENDENT_AMBULATORY_CARE_PROVIDER_SITE_OTHER): Payer: Managed Care, Other (non HMO)

## 2011-12-28 DIAGNOSIS — K754 Autoimmune hepatitis: Secondary | ICD-10-CM

## 2011-12-28 LAB — CBC WITH DIFFERENTIAL/PLATELET
Basophils Relative: 0.3 % (ref 0.0–3.0)
Eosinophils Absolute: 0.1 10*3/uL (ref 0.0–0.7)
Eosinophils Relative: 1.7 % (ref 0.0–5.0)
Hemoglobin: 13.1 g/dL (ref 12.0–15.0)
Lymphocytes Relative: 27 % (ref 12.0–46.0)
Monocytes Relative: 7.7 % (ref 3.0–12.0)
Neutro Abs: 5.1 10*3/uL (ref 1.4–7.7)
Neutrophils Relative %: 63.3 % (ref 43.0–77.0)
RBC: 4.69 Mil/uL (ref 3.87–5.11)
WBC: 8.1 10*3/uL (ref 4.5–10.5)

## 2011-12-28 LAB — HEPATIC FUNCTION PANEL
ALT: 72 U/L — ABNORMAL HIGH (ref 0–35)
AST: 76 U/L — ABNORMAL HIGH (ref 0–37)
Alkaline Phosphatase: 261 U/L — ABNORMAL HIGH (ref 39–117)
Bilirubin, Direct: 0.3 mg/dL (ref 0.0–0.3)
Total Protein: 6.9 g/dL (ref 6.0–8.3)

## 2011-12-29 ENCOUNTER — Other Ambulatory Visit: Payer: Self-pay

## 2011-12-29 DIAGNOSIS — K7689 Other specified diseases of liver: Secondary | ICD-10-CM

## 2011-12-29 DIAGNOSIS — R7989 Other specified abnormal findings of blood chemistry: Secondary | ICD-10-CM

## 2011-12-29 NOTE — Progress Notes (Signed)
Quick Note:  Liver tests abnormal but better than in the past  I think she needs another liver biopsy to evaluate autoimmune hepatitis on therapy  If she is ok to schedule that then go ahead with office visit 2 weeks after  If she is not willing to schedule over phone then OV next avail non-urgent ______

## 2012-01-08 ENCOUNTER — Other Ambulatory Visit: Payer: Self-pay

## 2012-01-08 MED ORDER — AZATHIOPRINE 50 MG PO TABS: 150.0000 mg | ORAL_TABLET | Freq: Every day | ORAL | Status: DC

## 2012-01-09 ENCOUNTER — Other Ambulatory Visit: Payer: Self-pay | Admitting: Radiology

## 2012-01-10 ENCOUNTER — Ambulatory Visit (HOSPITAL_COMMUNITY)
Admission: RE | Admit: 2012-01-10 | Discharge: 2012-01-10 | Disposition: A | Discharge status: Home / Self Care | Payer: Managed Care, Other (non HMO) | Source: Ambulatory Visit | Attending: Internal Medicine | Admitting: Internal Medicine

## 2012-01-10 ENCOUNTER — Other Ambulatory Visit: Payer: Self-pay | Admitting: Internal Medicine

## 2012-01-10 ENCOUNTER — Encounter (HOSPITAL_COMMUNITY): Payer: Self-pay

## 2012-01-10 DIAGNOSIS — K7689 Other specified diseases of liver: Secondary | ICD-10-CM

## 2012-01-10 DIAGNOSIS — R7989 Other specified abnormal findings of blood chemistry: Secondary | ICD-10-CM

## 2012-01-10 DIAGNOSIS — Z538 Procedure and treatment not carried out for other reasons: Secondary | ICD-10-CM | POA: Insufficient documentation

## 2012-01-10 LAB — CBC
HCT: 38.7 % (ref 36.0–46.0)
MCH: 27.5 pg (ref 26.0–34.0)
MCHC: 35.7 g/dL (ref 30.0–36.0)
MCV: 77.2 fL — ABNORMAL LOW (ref 78.0–100.0)
Platelets: 237 10*3/uL (ref 150–400)
RDW: 15.5 % (ref 11.5–15.5)

## 2012-01-10 MED ORDER — SODIUM CHLORIDE 0.9 % IV SOLN
INTRAVENOUS | Status: DC
  Administered 2012-01-10: 09:00:00 via INTRAVENOUS

## 2012-01-10 MED ORDER — FENTANYL CITRATE 0.05 MG/ML IJ SOLN
INTRAMUSCULAR | Status: AC
  Filled 2012-01-10: qty 4

## 2012-01-10 MED ORDER — MIDAZOLAM HCL 2 MG/2ML IJ SOLN
INTRAMUSCULAR | Status: AC
  Filled 2012-01-10: qty 4

## 2012-01-10 NOTE — Progress Notes (Signed)
Returned to room from radiology IV dc'd.Pt to be discharged and follow-up with Dr Leone Payor

## 2012-01-10 NOTE — ED Notes (Signed)
Procedure cancelled.

## 2012-01-10 NOTE — H&P (Signed)
[name] Short is an 46 y.o. female.   Chief Complaint: "I'm here for a liver biopsy" HPI: Patient with history of primary sclerosing cholangitis/autoimmune hepatitis presents today for random core liver biopsy.  Past Medical History  Diagnosis Date  . Autoimmune hepatitis     cholangitis overlap syndrome, sees Dr. Leone Payor   . Vitamin D deficiency   . Sickle cell trait     ? when testing done  . Primary sclerosing cholangitis   . History of pruritus of skin   . Hx of hyperglycemia     when on prednisone  . Anemia   . Allergic rhinosinusitis   . H/O high risk medication treatment     azathioprine/prednisone, normal TPMT genotype    Past Surgical History  Procedure Date  . Ercp 04/26/2004    [facility] in [place], Dr. Danny Lawless  . Flexible proctosigmoidoscopy 01/25/2005  . Tubal ligation     Family History  Problem Relation Age of Onset  . Hypertension Mother   . Hypertension Father   . Diabetes Mother   . Diabetes Father   . Colon cancer      father's aunt   Social History:  reports that she has never smoked. She has never used smokeless tobacco. She reports that she does not drink alcohol or use illicit drugs.  Allergies: No Known Allergies    Results for orders placed during the hospital encounter of 01/10/12 (from the past 48 hour(s))  APTT     Status: Normal   Collection Time   01/10/12  8:46 AM      Component Value Range Comment   aPTT 29  24 - 37 seconds   PROTIME-INR     Status: Normal   Collection Time   01/10/12  8:46 AM      Component Value Range Comment   Prothrombin Time 13.2  11.6 - 15.2 seconds    INR 1.01  0.00 - 1.49    Results for orders placed during the hospital encounter of 01/10/12  APTT      Component Value Range   aPTT 29  24 - 37 seconds  CBC      Component Value Range   WBC 7.1  4.0 - 10.5 K/uL   RBC 5.01  3.87 - 5.11 MIL/uL   Hemoglobin 13.8  12.0 - 15.0 g/dL   HCT 16.1  09.6 - 04.5 %   MCV 77.2 (*) 78.0 - 100.0 fL   MCH 27.5  26.0 - 34.0 pg   MCHC 35.7  30.0 - 36.0 g/dL   RDW 40.9  81.1 - 91.4 %   Platelets PENDING  150 - 400 K/uL  PROTIME-INR      Component Value Range   Prothrombin Time 13.2  11.6 - 15.2 seconds   INR 1.01  0.00 - 1.49    Review of Systems  Constitutional: Negative for fever and chills.  Respiratory: Negative for cough and shortness of breath.   Cardiovascular: Negative for chest pain.  Gastrointestinal: Negative for nausea, vomiting and abdominal pain.  Musculoskeletal: Negative for back pain.  Neurological: Negative for headaches.  Endo/Heme/Allergies: Does not bruise/bleed easily.    Blood pressure 114/78, pulse 65, temperature 98.2 F (36.8 C), temperature source Oral, resp. rate 16, height 5\' 7"  (1.702 m), weight 174 lb (78.926 kg), SpO2 100.00%. Physical Exam  Constitutional: She is oriented to person, place, and time. She appears well-developed and well-nourished.  Cardiovascular: Normal rate and regular rhythm.   Respiratory: Effort  normal and breath sounds normal.  GI: Soft. Bowel sounds are normal. There is no tenderness.  Musculoskeletal: Normal range of motion. She exhibits no edema.  Neurological: She is alert and oriented to person, place, and time.     Assessment/Plan Patient with hx of PSC/autoimmune hepatitis. Plan is for US guided random core liver biopsy today. Details/risks of procedure d/w pt with her understanding and consent.  ALLRED,D KEVIN 01/10/2012, 9:20 AM

## 2012-01-10 NOTE — Procedures (Signed)
Aborted random liver biopsy secondary to marked heterogeneity of the hepatic parenchyma withinterval enlargement of the caudate lobe of the liver.   Further evaluation with either MRI or CT is recommended to further evaluate for underlying mass prior to biopsy.   Above discussed with Dr. Leone Payor who is in agreement with the POC.

## 2012-01-12 ENCOUNTER — Telehealth: Payer: Self-pay

## 2012-01-12 DIAGNOSIS — R933 Abnormal findings on diagnostic imaging of other parts of digestive tract: Secondary | ICD-10-CM

## 2012-01-12 DIAGNOSIS — K754 Autoimmune hepatitis: Secondary | ICD-10-CM

## 2012-01-12 DIAGNOSIS — K8301 Primary sclerosing cholangitis: Secondary | ICD-10-CM

## 2012-01-12 NOTE — Telephone Encounter (Signed)
Patient aware.  She is scheduled for MRI/MRCP 01/22/12 4:15 arrival at [address].  She is aware to be NPO after noon.  She is asked to keep the office visit on 02/01/12 to discuss results and next step

## 2012-01-12 NOTE — Telephone Encounter (Signed)
Message copied by Annett Fabian on Fri Jan 12, 2012 11:06 AM ------      Message from: Stan Head E      Created: Wed Jan 10, 2012 10:46 AM      Regarding: needs MR/MRCP       Called by radiologist - liver different on [place] with big caudate lobe            We cancelled random liver bx and she needs to get an MR/MRCP of liver re: abnormal liver on [place], PSC and autoimmune hepatitis

## 2012-01-22 ENCOUNTER — Ambulatory Visit
Admission: RE | Admit: 2012-01-22 | Discharge: 2012-01-22 | Disposition: A | Discharge status: Home / Self Care | Payer: Managed Care, Other (non HMO) | Source: Ambulatory Visit | Attending: Internal Medicine | Admitting: Internal Medicine

## 2012-01-22 DIAGNOSIS — K754 Autoimmune hepatitis: Secondary | ICD-10-CM

## 2012-01-22 DIAGNOSIS — R933 Abnormal findings on diagnostic imaging of other parts of digestive tract: Secondary | ICD-10-CM

## 2012-01-22 DIAGNOSIS — K8301 Primary sclerosing cholangitis: Secondary | ICD-10-CM

## 2012-01-22 MED ORDER — GADOBENATE DIMEGLUMINE 529 MG/ML IV SOLN
16.0000 mL | Freq: Once | INTRAVENOUS | Status: AC | PRN
  Administered 2012-01-22: 16 mL via INTRAVENOUS

## 2012-01-23 ENCOUNTER — Other Ambulatory Visit: Payer: Self-pay

## 2012-01-23 DIAGNOSIS — K754 Autoimmune hepatitis: Secondary | ICD-10-CM

## 2012-01-23 DIAGNOSIS — K7460 Unspecified cirrhosis of liver: Secondary | ICD-10-CM

## 2012-01-23 NOTE — Progress Notes (Signed)
Quick Note:  No surprises on this exam -but looks like liver is developing scarring or cirrhosis - biopsy will tell Now that we know she does not have any tumors showing i wiant her to proceed with the liver biopsy and then follow-up with me 2-3 weeks after   ______

## 2012-01-30 ENCOUNTER — Other Ambulatory Visit: Payer: Self-pay | Admitting: Radiology

## 2012-02-01 ENCOUNTER — Ambulatory Visit: Payer: Managed Care, Other (non HMO) | Admitting: Internal Medicine

## 2012-02-01 ENCOUNTER — Ambulatory Visit (HOSPITAL_COMMUNITY): Payer: Managed Care, Other (non HMO)

## 2012-02-02 ENCOUNTER — Encounter (HOSPITAL_COMMUNITY): Payer: Self-pay | Admitting: Pharmacy Technician

## 2012-02-06 ENCOUNTER — Other Ambulatory Visit: Payer: Self-pay | Admitting: Radiology

## 2012-02-08 ENCOUNTER — Ambulatory Visit (HOSPITAL_COMMUNITY)
Admission: RE | Admit: 2012-02-08 | Discharge: 2012-02-08 | Disposition: A | Discharge status: Home / Self Care | Payer: Managed Care, Other (non HMO) | Source: Ambulatory Visit | Attending: Internal Medicine | Admitting: Internal Medicine

## 2012-02-08 ENCOUNTER — Encounter (HOSPITAL_COMMUNITY): Payer: Self-pay

## 2012-02-08 DIAGNOSIS — K8309 Other cholangitis: Secondary | ICD-10-CM | POA: Insufficient documentation

## 2012-02-08 DIAGNOSIS — Z79899 Other long term (current) drug therapy: Secondary | ICD-10-CM | POA: Insufficient documentation

## 2012-02-08 DIAGNOSIS — K7460 Unspecified cirrhosis of liver: Secondary | ICD-10-CM | POA: Insufficient documentation

## 2012-02-08 DIAGNOSIS — K754 Autoimmune hepatitis: Secondary | ICD-10-CM

## 2012-02-08 LAB — CBC
HCT: 37.3 % (ref 36.0–46.0)
MCHC: 34.9 g/dL (ref 30.0–36.0)
Platelets: 248 10*3/uL (ref 150–400)
RDW: 15 % (ref 11.5–15.5)
WBC: 6.2 10*3/uL (ref 4.0–10.5)

## 2012-02-08 LAB — PROTIME-INR
INR: 0.98 (ref 0.00–1.49)
Prothrombin Time: 12.9 seconds (ref 11.6–15.2)

## 2012-02-08 LAB — APTT: aPTT: 28 seconds (ref 24–37)

## 2012-02-08 MED ORDER — HYDROCODONE-ACETAMINOPHEN 5-325 MG PO TABS
1.0000 | ORAL_TABLET | ORAL | Status: DC | PRN
  Administered 2012-02-08: 1 via ORAL
  Filled 2012-02-08: qty 2
  Filled 2012-02-08: qty 1

## 2012-02-08 MED ORDER — MIDAZOLAM HCL 2 MG/2ML IJ SOLN
INTRAMUSCULAR | Status: AC
  Filled 2012-02-08: qty 4

## 2012-02-08 MED ORDER — MIDAZOLAM HCL 2 MG/2ML IJ SOLN
INTRAMUSCULAR | Status: AC | PRN
  Administered 2012-02-08: 1 mg via INTRAVENOUS
  Administered 2012-02-08: 2 mg via INTRAVENOUS

## 2012-02-08 MED ORDER — FENTANYL CITRATE 0.05 MG/ML IJ SOLN
INTRAMUSCULAR | Status: AC
  Filled 2012-02-08: qty 4

## 2012-02-08 MED ORDER — SODIUM CHLORIDE 0.9 % IV SOLN
INTRAVENOUS | Status: DC
  Administered 2012-02-08: 500 mL via INTRAVENOUS

## 2012-02-08 MED ORDER — FENTANYL CITRATE 0.05 MG/ML IJ SOLN
INTRAMUSCULAR | Status: AC | PRN
  Administered 2012-02-08: 100 ug via INTRAVENOUS

## 2012-02-08 NOTE — Progress Notes (Signed)
Patient ambulated in hallway and used restroom. Patient denies SOB, pain and dizziness.

## 2012-02-08 NOTE — Procedures (Signed)
Technically successful US guided biopsy of right lobe of liver.  No immediate complications.   

## 2012-02-08 NOTE — H&P (Signed)
[name] Short is an 46 y.o. female.   Chief Complaint: "I'm having a liver biopsy" HPI: Patient with history of PSC/ autoimmune hepatitis presents today for US guided random core liver biopsy.  Past Medical History  Diagnosis Date  . Autoimmune hepatitis     cholangitis overlap syndrome, sees Dr. Leone Payor   . Vitamin D deficiency   . Sickle cell trait     ? when testing done  . Primary sclerosing cholangitis   . History of pruritus of skin   . Hx of hyperglycemia     when on prednisone  . Anemia   . Allergic rhinosinusitis   . H/O high risk medication treatment     azathioprine/prednisone, normal TPMT genotype    Past Surgical History  Procedure Date  . Ercp 04/26/2004    [facility] in [place], Dr. Danny Lawless  . Flexible proctosigmoidoscopy 01/25/2005  . Tubal ligation     Family History  Problem Relation Age of Onset  . Hypertension Mother   . Hypertension Father   . Diabetes Mother   . Diabetes Father   . Colon cancer      father's aunt   Social History:  reports that she has never smoked. She has never used smokeless tobacco. She reports that she does not drink alcohol or use illicit drugs.  Allergies: No Known Allergies  Current outpatient prescriptions:azaTHIOprine (IMURAN) 50 MG tablet, Take 150 mg by mouth daily., Disp: , Rfl: ;  Calcium Carbonate-Vitamin D (CALTRATE 600+D PO), Take 1 tablet by mouth daily., Disp: , Rfl: ;  Multiple Vitamin (MULTIVITAMIN) tablet, Take 1 tablet by mouth daily., Disp: , Rfl:  Current facility-administered medications:0.9 %  sodium chloride infusion, , Intravenous, Continuous, Brayton El, PA, Last Rate: 20 mL/hr at 02/08/12 0917, 500 mL at 02/08/12 1610 Facility-Administered Medications Ordered in Other Encounters: fentaNYL (SUBLIMAZE) 0.05 MG/ML injection, , , , ;  midazolam (VERSED) 2 MG/2ML injection, , , ,   Results for orders placed during the hospital encounter of 02/08/12  CBC      Component Value Range   WBC 6.2   4.0 - 10.5 K/uL   RBC 4.76  3.87 - 5.11 MIL/uL   Hemoglobin 13.0  12.0 - 15.0 g/dL   HCT 96.0  45.4 - 09.8 %   MCV 78.4  78.0 - 100.0 fL   MCH 27.3  26.0 - 34.0 pg   MCHC 34.9  30.0 - 36.0 g/dL   RDW 11.9  14.7 - 82.9 %   Platelets PENDING  150 - 400 K/uL  02/08/12  PT/PTT pending  Review of Systems  Constitutional: Negative for fever and chills.  Respiratory: Negative for cough and shortness of breath.   Cardiovascular: Negative for chest pain.  Gastrointestinal: Negative for nausea, vomiting and abdominal pain.  Musculoskeletal: Negative for back pain.  Neurological: Negative for headaches.  Endo/Heme/Allergies: Does not bruise/bleed easily.    Blood pressure 129/76, pulse 77, temperature 98.2 F (36.8 C), temperature source Oral, resp. rate 18, height 5\' 7"  (1.702 m), weight 173 lb (78.472 kg), last menstrual period 01/25/2012, SpO2 100.00%. Physical Exam  Constitutional: She is oriented to person, place, and time. She appears well-developed and well-nourished.  Cardiovascular: Normal rate and regular rhythm.   Respiratory: Effort normal and breath sounds normal.  GI: Soft. Bowel sounds are normal. There is no tenderness.  Musculoskeletal: Normal range of motion. She exhibits no edema.  Neurological: She is alert and oriented to person, place, and time.  Assessment/Plan Pt with history of primary sclerosing cholangitis/autoimmune hepatitis . Plan is for US guided random core liver biopsy today. Details/risks of procedure d/w pt with her understanding and consent.  ALLRED,D KEVIN 02/08/2012, 9:26 AM

## 2012-02-11 NOTE — Progress Notes (Signed)
Quick Note:  Will discuss t follow-up visit Liver damage stable No cirrhosis  ______

## 2012-02-22 ENCOUNTER — Telehealth: Payer: Self-pay

## 2012-02-22 ENCOUNTER — Encounter: Payer: Self-pay | Admitting: Internal Medicine

## 2012-02-22 ENCOUNTER — Ambulatory Visit (INDEPENDENT_AMBULATORY_CARE_PROVIDER_SITE_OTHER): Payer: 59 | Admitting: Internal Medicine

## 2012-02-22 VITALS — BP 110/72 | HR 72 | Ht 67.0 in | Wt 175.6 lb

## 2012-02-22 DIAGNOSIS — K8301 Primary sclerosing cholangitis: Secondary | ICD-10-CM

## 2012-02-22 DIAGNOSIS — Z79899 Other long term (current) drug therapy: Secondary | ICD-10-CM

## 2012-02-22 DIAGNOSIS — K754 Autoimmune hepatitis: Secondary | ICD-10-CM

## 2012-02-22 DIAGNOSIS — K8309 Other cholangitis: Secondary | ICD-10-CM

## 2012-02-22 NOTE — Telephone Encounter (Signed)
Told patient that [facility] said patient has no active drug coverage , one was cancelled 12/22/11 and one cancelled on 02/20/12.  She said she has new insurance as of 02/21/12, she will call HR and find out which mail order pharmacy she has to use.  She [facility] if the insurance approves.  She will check and call me back with the information either Friday or Monday.

## 2012-02-22 NOTE — Progress Notes (Signed)
  Subjective:    Patient ID: [name] Short, female    DOB: 1965-08-11, 47 y.o.   MRN: [id number]  HPI The patient returns for followup of her primary sclerosing cholangitis and autoimmune hepatitis overlap syndrome. Over the past year she was weaned off prednisone and ursodiol and is only on azathioprine though she has been out of it for a few weeks. She has not yet received it from her pharmacy and she was under the impression we were following up on that. In the interim she has had imaging studies that were initially suggested the possibility of a mass lesion on ultrasound, as part of an ultrasound-guided biopsy. An MRI was performed which did not show any mass lesions. She went on to have her biopsy which showed basically stable disease with fibrosis but no cirrhosis. See results below. She feels well without any specific complaints today. She had a flu vaccine at work in October of 2013. Medications, allergies, past medical history, past surgical history, family history and social history are reviewed and updated in the EMR.   Review of Systems No other complaints.    Objective:   Physical Exam General:  NAD Eyes:   anicteric Lungs:  clear Heart:  S1S2 no rubs, murmurs or gallops Abdomen:  soft and nontender, BS+, no HSM/mass Skin:  Fine papular rash abdomen/chest wall   Data Reviewed: MRI liver 01/12/2012 IMPRESSION:  1. Heterogeneous signal and enhancement of the liver parenchyma  compatible with the history of autoimmune hepatitis. As noted on  recent ultrasound there is hypertrophy of the caudate lobe of the  liver.  2. No enhancing liver lesions identified.  3. Moderate intrahepatic bile duct dilatation with stable caliber  of the common bile duct measuring 6 mm.  02/08/2012 Liver, needle/core biopsy, right lobe - MINIMAL CHRONIC ACTIVE HEPATITIS WITH INFLAMED BILE DUCTS AND FOCAL FIBROSIS. Microscopic Comment Some of the portal areas have minimally increased chronic  inflammation and there are a few foci of mild interface change. There are also bile ducts with inflammation and reactive changes and there are a few bile ducts with concentric periductal fibrosis. Trichrome and reticulin stains show focal portal and periportal fibrosis; features of cirrhosis are not identified. The findings are consistent with minimal to mild hepatic changes and mild to moderate bile duct inflammation. No  Lab Results  Component Value Date   ALT 72* 12/28/2011   AST 76* 12/28/2011   ALKPHOS 261* 12/28/2011   BILITOT 1.0 12/28/2011       Assessment & Plan:   1. AUTOIMMUNE HEPATITIS-CHOLANGITIS minimal chronic active hepatitis with inflamed bile ducts and focal fibrosis on recent liver biopsy     stable, seems to have tolerated a need to monitor therapy with azathioprine well. Unfortunately she is out of her medication at this time.   2 Long-term use of immunosuppressant medication    1. We'll continue current therapy and look into the problems with her prescription 2. Repeat routine hepatic function panel and CBC testing in early April 3. See me in 6-12 months. 4. She has had a DEXA scan normal on 04/08/2010. 5. She's up-to-date with flu vaccination this year 6. Last pneumonia vaccine 2008 - she is due for booster - probably 2 different types. Will let her know and have her see PCP about this.  Cc:FRY,STEPHEN A, MD

## 2012-02-22 NOTE — Assessment & Plan Note (Signed)
Stable

## 2012-02-22 NOTE — Patient Instructions (Addendum)
We have put future orders in the computer for you to come in April 2014 to our lab in the basement and have labs drawn for a CBC/diff, hepatic panel.  No appointment needed and the lab is open from 8-5 Monday-Friday.  We will check with [facility] to see why you have not gotten your Azathioprine and contact you with that information.   Thank you for choosing me and [facility].  [name], M.D., [facility]

## 2012-04-10 ENCOUNTER — Telehealth: Payer: Self-pay

## 2012-04-10 NOTE — Telephone Encounter (Signed)
[name], MD - pneumonia vaccines More Detail >>      pneumonia vaccines    [name], MD      Sent: Tue April 09, 2012  1:12 PM    To: Rossie Muskrat, RN,CGRN        [name] Short    MRN: [id number] DOB: 1965/03/04     Pt Work: [phone] Pt Home: [phone]           Message    She needs to get two different types - her liver disease impairs immunity so that is why she needs them         Needs a PSV 13 and after 8 weeks should get a PPSV 23 vaccine          She can see PCP about this or I think I can prescribe and she could get at pharmacy          Let me know        Left message for patient to call back

## 2012-04-12 NOTE — Telephone Encounter (Signed)
Unable to leave a message, I will call again next week

## 2012-04-15 ENCOUNTER — Telehealth: Payer: Self-pay | Admitting: Family Medicine

## 2012-04-15 NOTE — Telephone Encounter (Signed)
I spoke with Genella Rife at Dr. Claris Che office.  She states that the patient needs to just call and set up an appt for the vaccines.  I have given Jenna the details of what she needs.  She will call Dr. Claris Che office to set up.  Per Genella Rife they would need to order the PCV 13.

## 2012-04-15 NOTE — Telephone Encounter (Signed)
Pt states Dr Marvell Fuller instructed her to call and schedule injection: pneumococcla conjugate inj. Pls advise. Eight week later:  ptsg23 injection. Pls advise.

## 2012-04-16 NOTE — Telephone Encounter (Signed)
Set her up for a shot visit to get a TDap and a 23 valent Pneumovax

## 2012-04-16 NOTE — Telephone Encounter (Signed)
Can we schedule this?  

## 2012-04-16 NOTE — Telephone Encounter (Signed)
Per Dr. Clent Ridges give the Pneumovax 23 and then 8 weeks later give the Tdap. I spoke with pt and gave information.

## 2012-04-22 ENCOUNTER — Ambulatory Visit: Payer: 59 | Admitting: Family Medicine

## 2012-04-26 ENCOUNTER — Ambulatory Visit: Payer: 59 | Admitting: Family Medicine

## 2012-04-29 ENCOUNTER — Ambulatory Visit (INDEPENDENT_AMBULATORY_CARE_PROVIDER_SITE_OTHER): Payer: 59 | Admitting: Family Medicine

## 2012-04-29 DIAGNOSIS — Z23 Encounter for immunization: Secondary | ICD-10-CM

## 2012-11-25 ENCOUNTER — Telehealth: Payer: Self-pay | Admitting: Family Medicine

## 2012-11-25 NOTE — Telephone Encounter (Signed)
Pt is calling to request a pap smear. She wouldn't release any further information. Please advise regarding scheduling. Thank you!

## 2012-11-26 NOTE — Telephone Encounter (Signed)
Per Dr. Clent Ridges, okay to schedule for a 15 minute visit, I spoke with pt and she only needs the pap, no CPE.

## 2012-11-26 NOTE — Telephone Encounter (Signed)
Scheduled

## 2012-11-27 ENCOUNTER — Ambulatory Visit (INDEPENDENT_AMBULATORY_CARE_PROVIDER_SITE_OTHER): Payer: 59 | Admitting: Family Medicine

## 2012-11-27 ENCOUNTER — Other Ambulatory Visit (HOSPITAL_COMMUNITY)
Admission: RE | Admit: 2012-11-27 | Discharge: 2012-11-27 | Disposition: A | Discharge status: Home / Self Care | Payer: BC Managed Care – PPO | Source: Ambulatory Visit | Attending: Family Medicine | Admitting: Family Medicine

## 2012-11-27 ENCOUNTER — Encounter: Payer: Self-pay | Admitting: Family Medicine

## 2012-11-27 VITALS — BP 130/80 | HR 114 | Temp 98.3°F | Wt 175.0 lb

## 2012-11-27 DIAGNOSIS — Z23 Encounter for immunization: Secondary | ICD-10-CM

## 2012-11-27 DIAGNOSIS — Z0000 Encounter for general adult medical examination without abnormal findings: Secondary | ICD-10-CM

## 2012-11-27 DIAGNOSIS — Z01419 Encounter for gynecological examination (general) (routine) without abnormal findings: Secondary | ICD-10-CM | POA: Insufficient documentation

## 2012-11-27 NOTE — Addendum Note (Signed)
Addended by: Aniceto Boss A on: 11/27/2012 04:47 PM   Modules accepted: Orders

## 2012-11-27 NOTE — Progress Notes (Signed)
Subjective:    Patient ID: [name] Short, female    DOB: 1965/10/21, 47 y.o.   MRN: [id number]  HPI Here for a GYN exam. She is doing well. She saw Dr. Leone Payor earlier this year and a liver biopsy showed fibrosis without cirrhosis, so her hepatitis is stable. Her menses are regular. She has not had a mammogram for several years.    Review of Systems  Constitutional: Negative.  Negative for fever, diaphoresis, activity change, appetite change, fatigue and unexpected weight change.  HENT: Negative.  Negative for congestion, ear pain, hearing loss, nosebleeds, sore throat, tinnitus, trouble swallowing and voice change.   Eyes: Negative.  Negative for photophobia, pain, discharge, redness and visual disturbance.  Respiratory: Negative.  Negative for apnea, cough, choking, chest tightness, shortness of breath, wheezing and stridor.   Cardiovascular: Negative.  Negative for chest pain, palpitations and leg swelling.  Gastrointestinal: Negative.  Negative for nausea, vomiting, abdominal pain, diarrhea, constipation, blood in stool, abdominal distention and rectal pain.  Endocrine: Negative.   Genitourinary: Negative.  Negative for dysuria, urgency, frequency, hematuria, flank pain, vaginal bleeding, vaginal discharge, enuresis, difficulty urinating, vaginal pain and menstrual problem.  Musculoskeletal: Negative.  Negative for arthralgias, back pain, gait problem, joint swelling, myalgias, neck pain and neck stiffness.  Skin: Negative.  Negative for color change, pallor, rash and wound.  Neurological: Negative.  Negative for dizziness, tremors, seizures, syncope, speech difficulty, weakness, light-headedness, numbness and headaches.  Hematological: Negative for adenopathy. Does not bruise/bleed easily.  Psychiatric/Behavioral: Negative.  Negative for hallucinations, behavioral problems, confusion, sleep disturbance, dysphoric mood and agitation. The patient is not nervous/anxious.         Objective:   Physical Exam  Constitutional: She is oriented to person, place, and time. She appears well-developed and well-nourished. No distress.  HENT:  Head: Normocephalic and atraumatic.  Right Ear: External ear normal.  Left Ear: External ear normal.  Nose: Nose normal.  Mouth/Throat: Oropharynx is clear and moist. No oropharyngeal exudate.  Eyes: Conjunctivae and EOM are normal. Pupils are equal, round, and reactive to light. Right eye exhibits no discharge. Left eye exhibits no discharge. No scleral icterus.  Neck: Normal range of motion. Neck supple. No JVD present. No thyromegaly present.  Cardiovascular: Normal rate, regular rhythm, normal heart sounds and intact distal pulses.  Exam reveals no gallop and no friction rub.   No murmur heard. Pulmonary/Chest: Effort normal and breath sounds normal. No stridor. No respiratory distress. She has no wheezes. She has no rales. She exhibits no tenderness.  Abdominal: Soft. Normal appearance and bowel sounds are normal. She exhibits no distension, no abdominal bruit, no ascites and no mass. There is no hepatosplenomegaly. There is no tenderness. There is no rigidity, no rebound and no guarding. No hernia.  Genitourinary: Rectum normal, vagina normal and uterus normal. No breast swelling, tenderness, discharge or bleeding. Cervix exhibits no motion tenderness, no discharge and no friability. Right adnexum displays no mass, no tenderness and no fullness. Left adnexum displays no mass, no tenderness and no fullness. No erythema, tenderness or bleeding around the vagina. No vaginal discharge found.  Musculoskeletal: Normal range of motion. She exhibits no edema and no tenderness.  Lymphadenopathy:    She has no cervical adenopathy.  Neurological: She is alert and oriented to person, place, and time. She has normal reflexes. No cranial nerve deficit. She exhibits normal muscle tone. Coordination normal.  Skin: Skin is warm and dry. No rash noted.  She is not diaphoretic. No erythema.  No pallor.  Psychiatric: She has a normal mood and affect. Her behavior is normal. Judgment and thought content normal.          Assessment & Plan:  Well exam.

## 2012-12-02 NOTE — Progress Notes (Signed)
Quick Note:  I spoke with pt ______ 

## 2013-02-26 ENCOUNTER — Encounter: Payer: 59 | Admitting: Family Medicine

## 2014-08-24 IMAGING — US US ABDOMEN LIMITED
1 series · 14 of 14 positions shown · non-contrast
Comparison: Ultrasound guided random liver biopsy - 06/18/2007;

CLINICAL DATA: Autoimmune hepatitis, elevated LFTs. Please perform
random liver biopsy.

LIMITED ABDOMINAL ULTRASOUND

[Series 1: us abdomen limited · 0.32mm/px · 14 of 14 slices shown]
[im 1/14]
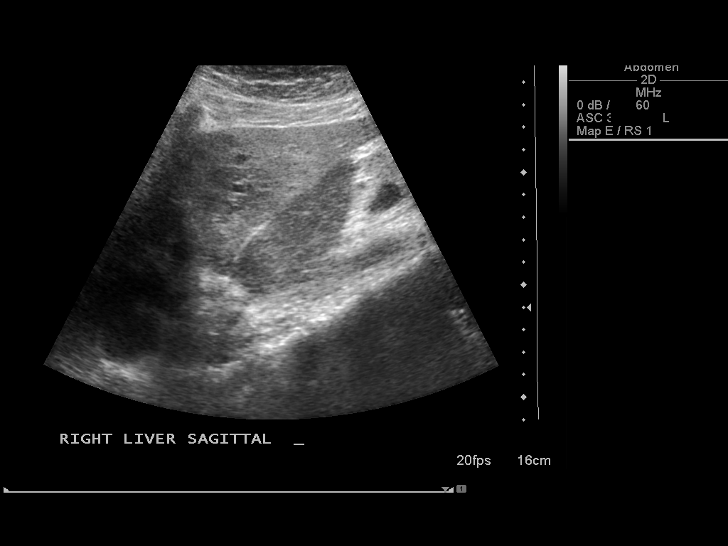
[im 2/14]
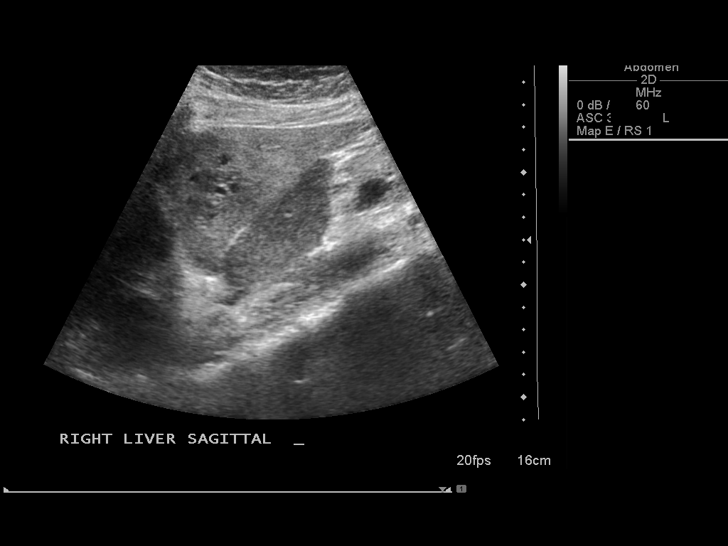
[im 3/14]
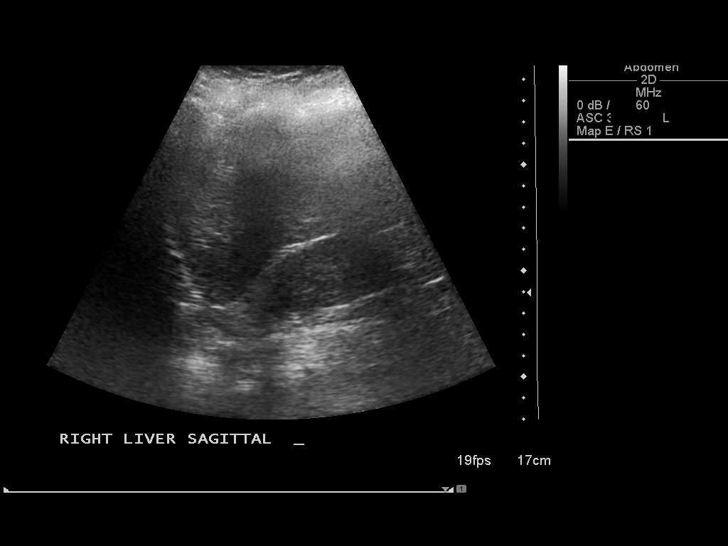
[im 4/14]
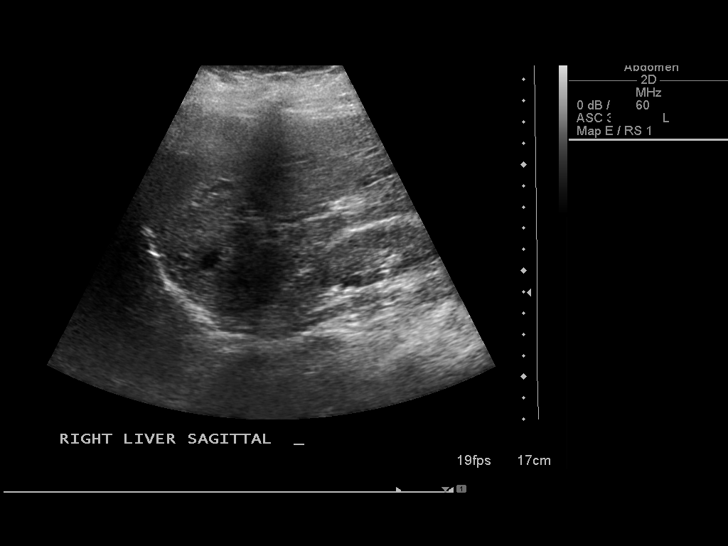
[im 5/14]
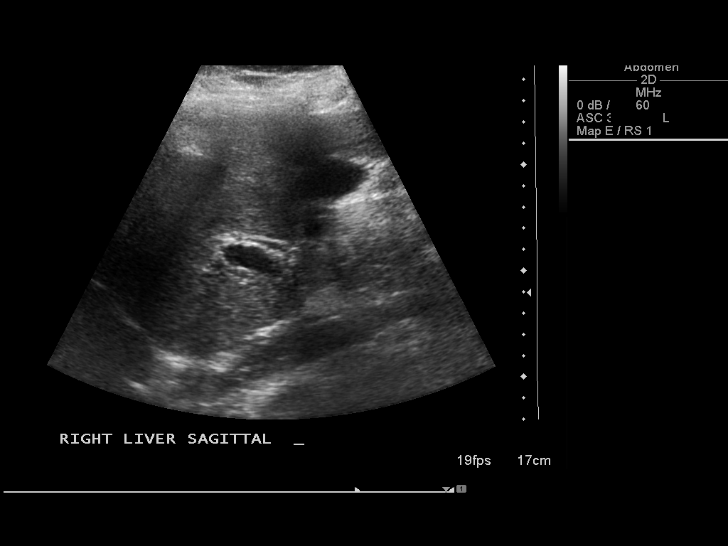
[im 6/14]
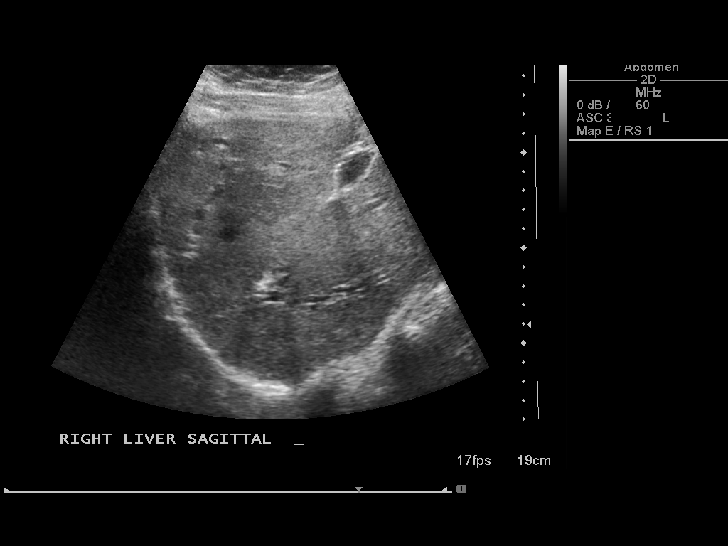
[im 7/14]
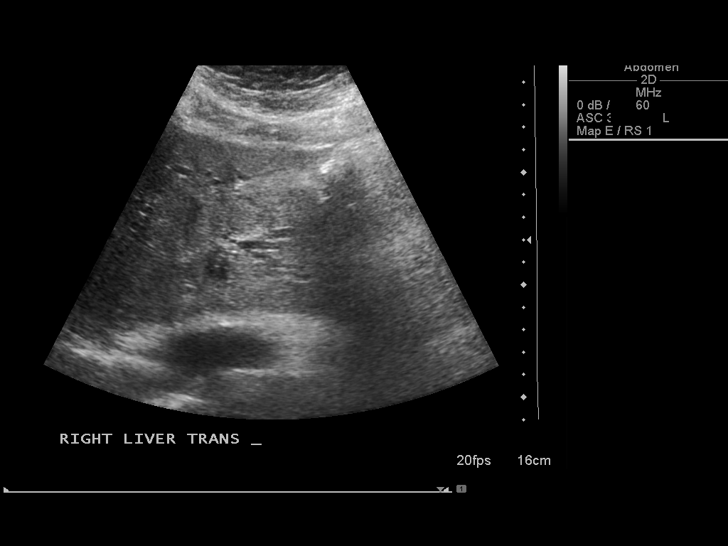
[im 8/14]
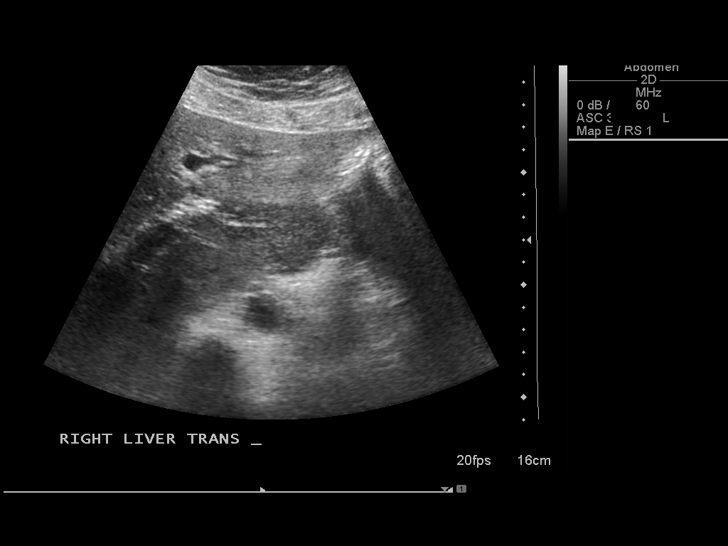
[im 9/14]
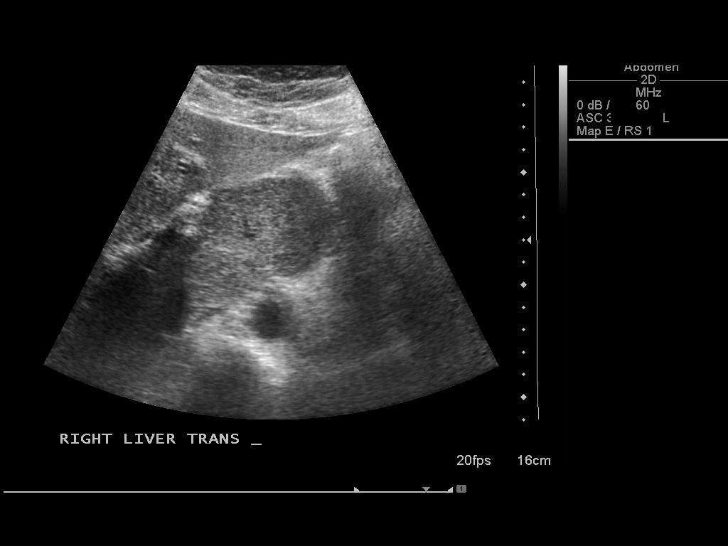
[im 10/14]
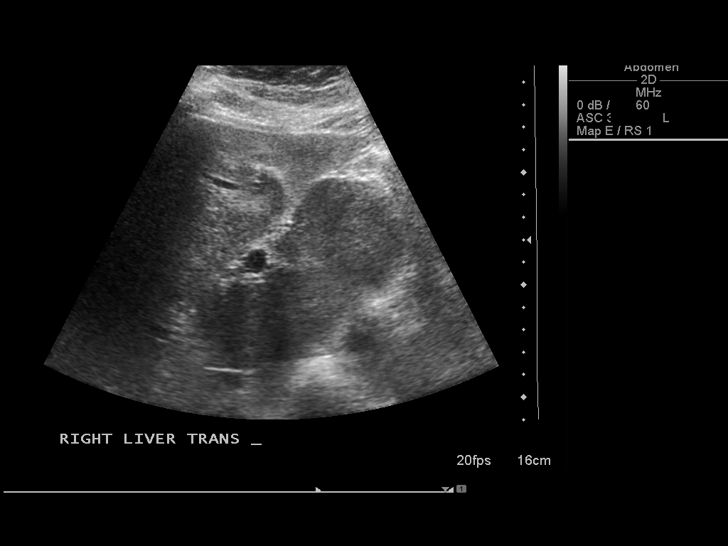
[im 11/14]
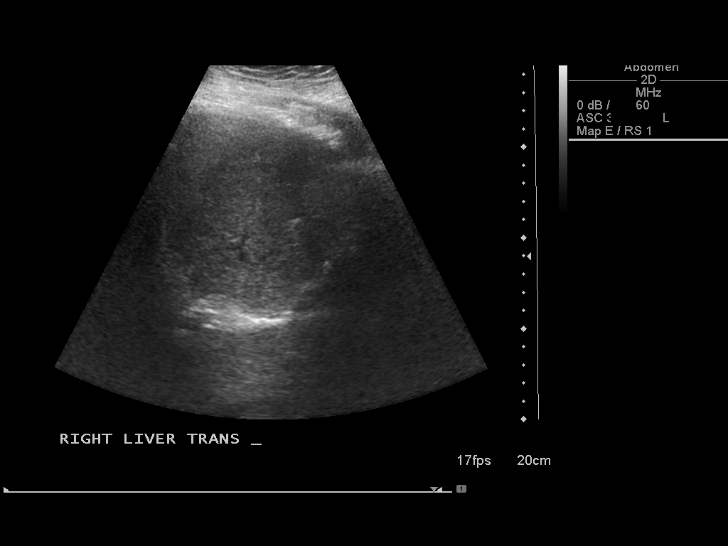
[im 12/14]
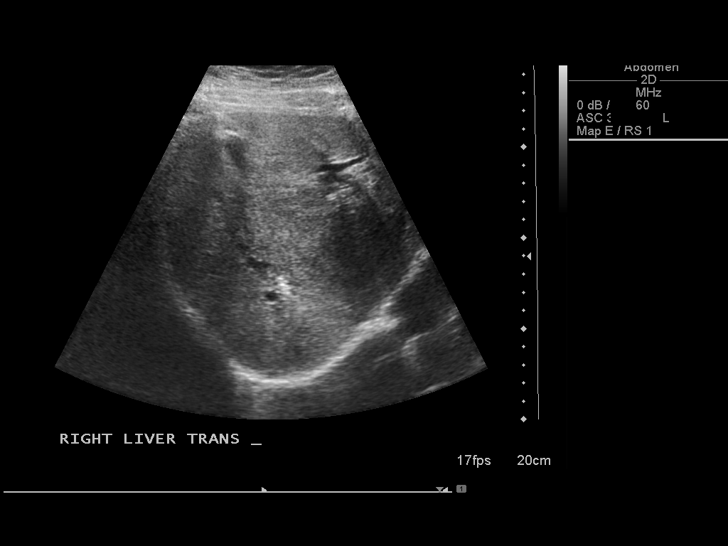
[im 13/14]
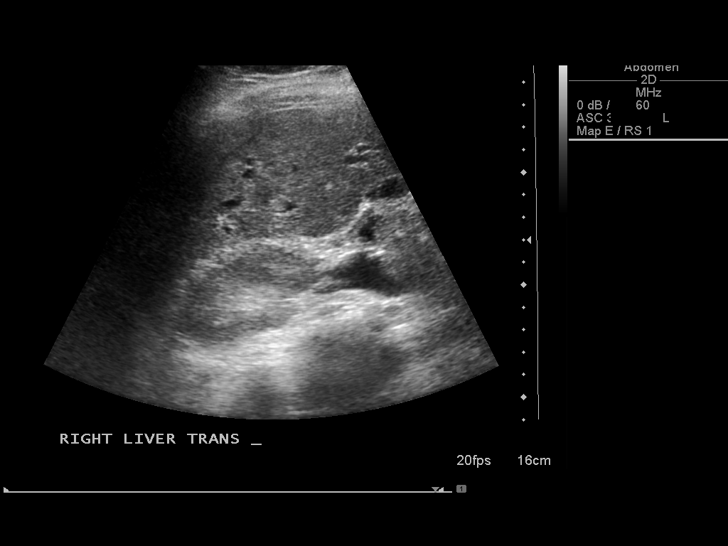
[im 14/14]
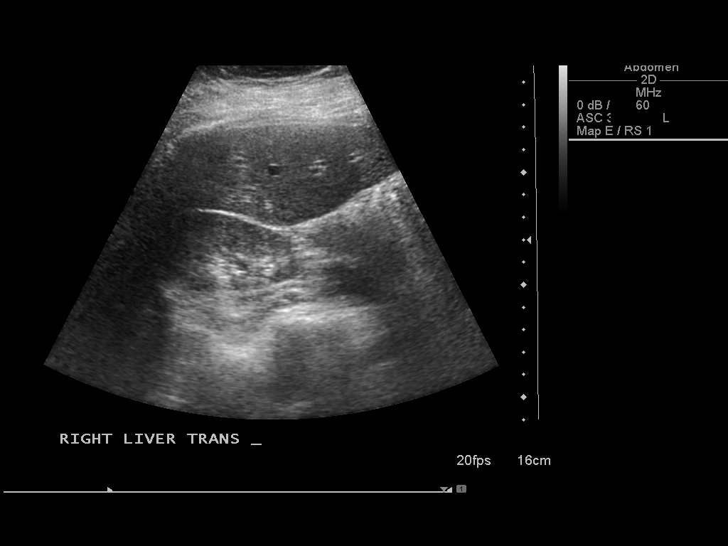

[14 of 14 positions shown; findings below may reference images not displayed]

07/12/2004; ERCP - 03/14/2004; abdominal ultrasound - 02/02/2004;
abdominal CT - 01/27/2004
FINDINGS: Preprocedural ultrasound scanning performed by both the assisting
sonographer as well as the dictating radiologist demonstrates
marked coarsened heterogeneous appearance of the hepatic parenchyma
as well as apparent interval enlargement of the caudate lobe.

Given this interval change in the sonographic appearance of the
liver, these findings were discussed with referring physician (Dr.
Algarin) and the decision was made to obtain a preprocedural
MRI/MRCP prior to potential liver biopsy.

As such, the procedure was cancelled.
IMPRESSION: Abnormal appearance of the hepatic parenchyma with apparent
enlargement of the caudate lobe.  Further evaluation with MRI /
MRCP will be obtained prior to future potential liver biopsy.

Above findings discussed Dr. Algarin at the time of procedure
cancellation.

## 2021-07-26 ENCOUNTER — Encounter (HOSPITAL_BASED_OUTPATIENT_CLINIC_OR_DEPARTMENT_OTHER): Payer: Self-pay | Admitting: Radiology

## 2021-07-26 ENCOUNTER — Emergency Department (HOSPITAL_BASED_OUTPATIENT_CLINIC_OR_DEPARTMENT_OTHER): Payer: BC Managed Care – PPO

## 2021-07-26 ENCOUNTER — Observation Stay (HOSPITAL_BASED_OUTPATIENT_CLINIC_OR_DEPARTMENT_OTHER)
Admission: EM | Admit: 2021-07-26 | Discharge: 2021-07-28 | Disposition: A | Discharge status: Home / Self Care | Payer: BC Managed Care – PPO | Attending: Emergency Medicine | Admitting: Emergency Medicine

## 2021-07-26 ENCOUNTER — Other Ambulatory Visit: Payer: Self-pay

## 2021-07-26 DIAGNOSIS — R1013 Epigastric pain: Secondary | ICD-10-CM | POA: Diagnosis present

## 2021-07-26 DIAGNOSIS — I10 Essential (primary) hypertension: Secondary | ICD-10-CM | POA: Diagnosis not present

## 2021-07-26 DIAGNOSIS — Z79899 Other long term (current) drug therapy: Secondary | ICD-10-CM | POA: Insufficient documentation

## 2021-07-26 DIAGNOSIS — I2694 Multiple subsegmental pulmonary emboli without acute cor pulmonale: Principal | ICD-10-CM | POA: Insufficient documentation

## 2021-07-26 DIAGNOSIS — D696 Thrombocytopenia, unspecified: Secondary | ICD-10-CM | POA: Diagnosis not present

## 2021-07-26 DIAGNOSIS — D571 Sickle-cell disease without crisis: Secondary | ICD-10-CM

## 2021-07-26 DIAGNOSIS — K743 Primary biliary cirrhosis: Secondary | ICD-10-CM

## 2021-07-26 DIAGNOSIS — I2699 Other pulmonary embolism without acute cor pulmonale: Secondary | ICD-10-CM | POA: Diagnosis present

## 2021-07-26 DIAGNOSIS — K754 Autoimmune hepatitis: Secondary | ICD-10-CM | POA: Diagnosis present

## 2021-07-26 LAB — CBC WITH DIFFERENTIAL/PLATELET
Abs Immature Granulocytes: 0.02 10*3/uL (ref 0.00–0.07)
Basophils Absolute: 0 10*3/uL (ref 0.0–0.1)
Basophils Relative: 0 %
Eosinophils Absolute: 0 10*3/uL (ref 0.0–0.5)
Eosinophils Relative: 1 %
HCT: 43.1 % (ref 36.0–46.0)
Hemoglobin: 15.3 g/dL — ABNORMAL HIGH (ref 12.0–15.0)
Immature Granulocytes: 0 %
Lymphocytes Relative: 13 %
Lymphs Abs: 1.2 10*3/uL (ref 0.7–4.0)
MCH: 27.2 pg (ref 26.0–34.0)
MCHC: 35.5 g/dL (ref 30.0–36.0)
MCV: 76.7 fL — ABNORMAL LOW (ref 80.0–100.0)
Monocytes Absolute: 0.5 10*3/uL (ref 0.1–1.0)
Monocytes Relative: 6 %
Neutro Abs: 7 10*3/uL (ref 1.7–7.7)
Neutrophils Relative %: 80 %
Platelets: 130 10*3/uL — ABNORMAL LOW (ref 150–400)
RBC: 5.62 MIL/uL — ABNORMAL HIGH (ref 3.87–5.11)
RDW: 14.9 % (ref 11.5–15.5)
WBC: 8.8 10*3/uL (ref 4.0–10.5)
nRBC: 0 % (ref 0.0–0.2)

## 2021-07-26 LAB — TROPONIN I (HIGH SENSITIVITY): Troponin I (High Sensitivity): <2 ng/L (ref <18)

## 2021-07-26 LAB — COMPREHENSIVE METABOLIC PANEL
ALT: 54 U/L — ABNORMAL HIGH (ref 0–44)
AST: 43 U/L — ABNORMAL HIGH (ref 15–41)
Albumin: 3.7 g/dL (ref 3.5–5.0)
Alkaline Phosphatase: 128 U/L — ABNORMAL HIGH (ref 38–126)
Anion gap: 7 (ref 5–15)
BUN: 7 mg/dL (ref 6–20)
CO2: 20 mmol/L — ABNORMAL LOW (ref 22–32)
Calcium: 9 mg/dL (ref 8.9–10.3)
Chloride: 110 mmol/L (ref 98–111)
Creatinine, Ser: 0.53 mg/dL (ref 0.44–1.00)
GFR, Estimated: >60 mL/min (ref >60)
Glucose, Bld: 89 mg/dL (ref 70–99)
Potassium: 3.5 mmol/L (ref 3.5–5.1)
Sodium: 137 mmol/L (ref 135–145)
Total Bilirubin: 2.2 mg/dL — ABNORMAL HIGH (ref 0.3–1.2)
Total Protein: 7.5 g/dL (ref 6.5–8.1)

## 2021-07-26 LAB — PROTIME-INR
INR: 1.3 — ABNORMAL HIGH (ref 0.8–1.2)
Prothrombin Time: 15.8 seconds — ABNORMAL HIGH (ref 11.4–15.2)

## 2021-07-26 LAB — D-DIMER, QUANTITATIVE: D-Dimer, Quant: 3.15 ug/mL-FEU — ABNORMAL HIGH (ref 0.00–0.50)

## 2021-07-26 LAB — LIPASE, BLOOD: Lipase: 24 U/L (ref 11–51)

## 2021-07-26 MED ORDER — HEPARIN BOLUS VIA INFUSION
5000.0000 [IU] | Freq: Once | INTRAVENOUS | Status: AC
  Administered 2021-07-26: 5000 [IU] via INTRAVENOUS

## 2021-07-26 MED ORDER — IOHEXOL 350 MG/ML SOLN: 100.0000 mL | Freq: Once | INTRAVENOUS | Status: DC | PRN

## 2021-07-26 MED ORDER — IOHEXOL 350 MG/ML SOLN
80.0000 mL | Freq: Once | INTRAVENOUS | Status: AC | PRN
  Administered 2021-07-26: 80 mL via INTRAVENOUS

## 2021-07-26 MED ORDER — FENTANYL CITRATE PF 50 MCG/ML IJ SOSY
25.0000 ug | PREFILLED_SYRINGE | Freq: Once | INTRAMUSCULAR | Status: AC
  Administered 2021-07-26: 25 ug via INTRAVENOUS
  Filled 2021-07-26: qty 1

## 2021-07-26 MED ORDER — HEPARIN (PORCINE) 25000 UT/250ML-% IV SOLN
1200.0000 [IU]/h | INTRAVENOUS | Status: DC
  Administered 2021-07-26: 1300 [IU]/h via INTRAVENOUS
  Administered 2021-07-27: 1100 [IU]/h via INTRAVENOUS
  Filled 2021-07-26 (×2): qty 250

## 2021-07-26 MED ORDER — IOHEXOL 350 MG/ML SOLN
75.0000 mL | Freq: Once | INTRAVENOUS | Status: AC | PRN
  Administered 2021-07-26: 75 mL via INTRAVENOUS

## 2021-07-26 NOTE — ED Notes (Signed)
Hot pack for pain under right breast

## 2021-07-26 NOTE — ED Notes (Signed)
Client had return from CT scan, per the CT tech, IV infiltrated upon injection of x-ray contrast. IV was immediately removed, CT tech informed provider and RN of incident, CT tech has marked skin with skin marker, warm blanket was applied and RUE was elevated and positioned for comfort. Rt radial pulse easily detected by this RN with good capillary refill. ED MD again was consulted to aid in establishing new IV access via Ultrasound.

## 2021-07-26 NOTE — Progress Notes (Signed)
ANTICOAGULATION CONSULT NOTE - Initial Consult  Pharmacy Consult for IV heparin Indication: pulmonary embolus  No Known Allergies  Patient Measurements: Height: 5\' 7"  (170.2 cm) Weight: 84.4 kg (186 lb) IBW/kg (Calculated) : 61.6 Heparin Dosing Weight: 79.2 kg   Vital Signs: Temp: 98.2 F (36.8 C) (06/06 1038) Temp Source: Oral (06/06 1038) BP: 142/90 (06/06 1619) Pulse Rate: 85 (06/06 1619)  Labs: Recent Labs    07/26/21 1111 07/26/21 1112 07/26/21 1210  HGB  --   --  15.3*  HCT  --   --  43.1  PLT  --   --  130*  CREATININE 0.53  --   --   TROPONINIHS  --  <2  --     Estimated Creatinine Clearance: 89.7 mL/min (by C-G formula based on SCr of 0.53 mg/dL).  Assessment: 81 YOF presenting to 59 c/o SOB. There is concern for pulmonary embolism, but unable to obtain CT at this time to confirm. Pharmacy has been consulted for heparin dosing.   Patient noted to have difficulties with IV access per chart review. No anticoagulation PTA.   Goal of Therapy:  Heparin level 0.3-0.7 units/ml Monitor platelets by anticoagulation protocol: Yes   Plan:  Give 5000 units bolus x 1 Start heparin infusion at 1,300 units/hr Check anti-Xa level in 8 hours and daily while on heparin Continue to monitor H&H and platelets F/U imaging as able   [employer], PharmD PGY-1 Acute Care Resident  07/26/2021 7:09 PM

## 2021-07-26 NOTE — ED Notes (Signed)
States epigastric pain is non-radiating, denies abd tenderness, nausea, vomiting or diarrhea, states able to eat without difficulty

## 2021-07-26 NOTE — ED Provider Notes (Signed)
[facility] EMERGENCY DEPARTMENT Provider Note   CSN: [id number] Arrival date & time: 07/26/21  1022     History  Chief Complaint  Patient presents with   Abdominal Pain    Epigastric pain   Shortness of Breath    [name] Short is a 56 y.o. female presenting to the ED with a chief complaint of shortness of breath.  States that she has felt "winded" even with walking short distances and when she leans forward.  This began yesterday and has persisted since then.  Denies any chest pain, abdominal pain, leg swelling.  Reports a slight cough but does not feel that this is different than her baseline.  Has not taken any medication to help with her symptoms.  No chronic lung disease that she is aware of, no history of DVT or PE, recent immobilization or hormone use.  Denies any vomiting, fever.   Abdominal Pain Associated symptoms: shortness of breath   Associated symptoms: no chest pain, no chills, no constipation, no cough, no diarrhea, no dysuria, no fever, no hematuria, no nausea, no sore throat and no vomiting   Shortness of Breath Associated symptoms: no abdominal pain, no chest pain, no cough, no ear pain, no fever, no rash, no sore throat, no vomiting and no wheezing       Home Medications Prior to Admission medications   Medication Sig Start Date End Date Taking? Authorizing Provider  azaTHIOprine (IMURAN) 50 MG tablet Take 150 mg by mouth daily. 01/08/12   Gatha Mayer, MD  Calcium Carbonate-Vitamin D (CALTRATE 600+D PO) Take 1 tablet by mouth daily.    [provider]  Multiple Vitamin (MULTIVITAMIN) tablet Take 1 tablet by mouth daily.    [provider]  omeprazole (PRILOSEC) 20 MG capsule Take 20 mg by mouth daily. 07/07/21   [provider]      Allergies    Patient has no known allergies.    Review of Systems   Review of Systems  Constitutional:  Negative for appetite change, chills and fever.  HENT:  Negative for ear pain,  rhinorrhea, sneezing and sore throat.   Eyes:  Negative for photophobia and visual disturbance.  Respiratory:  Positive for shortness of breath. Negative for cough, chest tightness and wheezing.   Cardiovascular:  Negative for chest pain and palpitations.  Gastrointestinal:  Negative for abdominal pain, blood in stool, constipation, diarrhea, nausea and vomiting.  Genitourinary:  Negative for dysuria, hematuria and urgency.  Musculoskeletal:  Negative for myalgias.  Skin:  Negative for rash.  Neurological:  Negative for dizziness, weakness and light-headedness.   Physical Exam Updated Vital Signs BP (!) 142/90   Pulse 85   Temp 98.2 F (36.8 C) (Oral)   Resp (!) 23   Ht 5\' 7"  (1.702 m)   Wt 84.4 kg   LMP 01/25/2012   SpO2 97%   BMI 29.13 kg/m  Physical Exam Vitals and nursing note reviewed.  Constitutional:      General: She is not in acute distress.    Appearance: She is well-developed.     Comments: Speaking complete sentences without difficulty.  No signs of respiratory distress  HENT:     Head: Normocephalic and atraumatic.     Nose: Nose normal.  Eyes:     General: No scleral icterus.       Left eye: No discharge.     Conjunctiva/sclera: Conjunctivae normal.  Cardiovascular:     Rate and Rhythm: Regular rhythm. Tachycardia  present.     Heart sounds: Normal heart sounds. No murmur heard.   No friction rub. No gallop.  Pulmonary:     Effort: Pulmonary effort is normal. No respiratory distress.     Breath sounds: Normal breath sounds.  Abdominal:     General: Bowel sounds are normal. There is no distension.     Palpations: Abdomen is soft.     Tenderness: There is no abdominal tenderness. There is no guarding.     Comments: Abdomen is soft, nontender nondistended  Musculoskeletal:        General: No tenderness. Normal range of motion.     Cervical back: Normal range of motion and neck supple.     Comments: No lower extremity edema, erythema or calf tenderness  bilaterally.  Skin:    General: Skin is warm and dry.     Findings: No rash.  Neurological:     Mental Status: She is alert.     Motor: No abnormal muscle tone.     Coordination: Coordination normal.    ED Results / Procedures / Treatments   Labs (all labs ordered are listed, but only abnormal results are displayed) Labs Reviewed  COMPREHENSIVE METABOLIC PANEL - Abnormal; Notable for the following components:      Result Value   CO2 20 (*)    AST 43 (*)    ALT 54 (*)    Alkaline Phosphatase 128 (*)    Total Bilirubin 2.2 (*)    All other components within normal limits  D-DIMER, QUANTITATIVE - Abnormal; Notable for the following components:   D-Dimer, Quant 3.15 (*)    All other components within normal limits  CBC WITH DIFFERENTIAL/PLATELET - Abnormal; Notable for the following components:   RBC 5.62 (*)    Hemoglobin 15.3 (*)    MCV 76.7 (*)    Platelets 130 (*)    All other components within normal limits  LIPASE, BLOOD  CBC WITH DIFFERENTIAL/PLATELET  PROTIME-INR  HEPARIN LEVEL (UNFRACTIONATED)  TROPONIN I (HIGH SENSITIVITY)    EKG EKG Interpretation  Date/Time:  Tuesday July 26 2021 10:40:49 EDT Ventricular Rate:  94 PR Interval:  144 QRS Duration: 70 QT Interval:  346 QTC Calculation: 432 R Axis:   43 Text Interpretation: Normal sinus rhythm Anterior infarct , age undetermined Abnormal ECG No previous ECGs available Confirmed by Gareth Morgan [phone]) on 07/26/2021 11:58:21 AM  Radiology DG Chest 2 View  Result Date: 07/26/2021 CLINICAL DATA:  Shortness of breath, epigastric pain EXAM: CHEST - 2 VIEW COMPARISON:  None Available. FINDINGS: Cardiomediastinal silhouette is within normal limits. There are hazy bibasilar airspace opacities. No large effusion. No pneumothorax. No acute osseous abnormality. Levoconvex thoracic and dextroconvex lumbar curvature is. Mild degenerative changes of the spin. IMPRESSION: Hazy bibasilar airspace opacities, favored to  represent subsegmental atelectasis Electronically Signed   By: Maurine Simmering M.D.   On: 07/26/2021 11:30   CT Angio Chest PE W/Cm &/Or Wo Cm  Addendum Date: 07/26/2021   ADDENDUM REPORT: 07/26/2021 18:50 ADDENDUM: These results were called by telephone at the time of interpretation on 07/26/2021 at 6:50 pm to provider Dr. Laverta Baltimore, who verbally acknowledged these results. Electronically Signed   By: Iven Finn M.D.   On: 07/26/2021 18:50   Result Date: 07/26/2021 CLINICAL DATA:  Pulmonary embolism (PE) suspected, high prob. Epigastric upper abdominal pain. Shortness of breath. EXAM: CT ANGIOGRAPHY CHEST WITH CONTRAST TECHNIQUE: Multidetector CT imaging of the chest was performed using the standard protocol during bolus  administration of intravenous contrast. Multiplanar CT image reconstructions and MIPs were obtained to evaluate the vascular anatomy. RADIATION DOSE REDUCTION: This exam was performed according to the departmental dose-optimization program which includes automated exposure control, adjustment of the mA and/or kV according to patient size and/or use of iterative reconstruction technique. CONTRAST:  57mL OMNIPAQUE IOHEXOL 350 MG/ML SOLN COMPARISON:  Chest x-ray 07/26/2021 FINDINGS: Cardiovascular: Satisfactory opacification of the pulmonary arteries to the segmental level. Filling defect of the right upper lobe, right lower lobe, left upper lobe, left lower lobe segmental and subsegmental pulmonary arteries. The main pulmonary artery is normal in caliber. Normal heart size. No pericardial effusion. Mediastinum/Nodes: No enlarged mediastinal, hilar, or axillary lymph nodes. Thyroid gland, trachea, and esophagus demonstrate no significant findings. Lungs/Pleura: Bilateral lower lobe patchy consolidation. No focal consolidation. No pulmonary nodule. No pulmonary mass. Trace bilateral pleural effusions. No pneumothorax. Upper Abdomen: No acute abnormality. Musculoskeletal: No chest wall abnormality. No  suspicious lytic or blastic osseous lesions. No acute displaced fracture. Multilevel degenerative changes of the spine. Review of the MIP images confirms the above findings. IMPRESSION: 1. Segmental and subsegmental pulmonary arteries of the left upper, left lower, right upper, right lower lobes. No findings of right heart strain or pulmonary infarction. 2. Bilateral lower lobe patchy consolidation. Finding could represent a combination of atelectasis and/or infection/inflammation. Limited evaluation due to timing of contrast. 3. Trace bilateral pleural effusions. Electronically Signed: By: Iven Finn M.D. On: 07/26/2021 18:44   US Abdomen Limited RUQ (LIVER/GB)  Result Date: 07/26/2021 CLINICAL DATA:  Transaminitis. Epigastric pain. Autoimmune hepatitis. Cholangitis. EXAM: ULTRASOUND ABDOMEN LIMITED RIGHT UPPER QUADRANT COMPARISON:  MRI abdomen 01/22/2012; right upper quadrant abdominal ultrasound 01/10/2012; MRI abdomen 05/18/2021; FINDINGS: Gallbladder: The gallbladder was difficult to visualize due to overlying bowel gas. Normal gallbladder wall thickness of 2 mm. Negative sonographic Murphy's sign. No gallstone, gallbladder sludge, or pericholecystic fluid. Common bile duct: Diameter: 2 mm, within normal limits. Note is made of peripheral segmental intrahepatic biliary ductal dilatation better seen on prior MRI. Liver: Minimal nodular liver contours, similar to prior. There is diffuse heterogeneous echogenicity. Within the anterior left hepatic lobe there is an echogenic mass measuring up to approximately 3.0 x 1.6 x 2.9 cm. Portal vein is patent on color Doppler imaging with normal direction of blood flow towards the liver. Other: None. IMPRESSION: 1. Normal appearance of the gallbladder. 2. 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 3. An echogenic mass measured within the anterior left hepatic lobe is favored to  represent a region of enhancing fibrosis and sclerosing parenchymal contraction with segmental biliary ductal dilatation in a similar region better seen on prior MRI 05/18/2021. Electronically Signed   By: Yvonne Kendall M.D.   On: 07/26/2021 12:56    Procedures .Critical Care Performed by: Delia Heady, PA-C Authorized by: Delia Heady, PA-C   Critical care provider statement:    Critical care time (minutes):  35   Critical care time was exclusive of:  Separately billable procedures and treating other patients and teaching time   Critical care was necessary to treat or prevent imminent or life-threatening deterioration of the following conditions:  Circulatory failure, cardiac failure and respiratory failure   Critical care was time spent personally by me on the following activities:  Development of treatment plan with patient or surrogate, discussions with primary provider, evaluation of patient's response to treatment, examination of patient, obtaining history from patient or surrogate, ordering  and performing treatments and interventions, ordering and review of laboratory studies, ordering and review of radiographic studies, pulse oximetry, re-evaluation of patient's condition and review of old charts   I assumed direction of critical care for this patient from another provider in my specialty: no     Care discussed with: admitting provider      Medications Ordered in ED Medications  heparin bolus via infusion 5,000 Units (has no administration in time range)  heparin ADULT infusion 100 units/mL (25000 units/247mL) (has no administration in time range)  fentaNYL (SUBLIMAZE) injection 25 mcg (25 mcg Intravenous Given 07/26/21 1639)  iohexol (OMNIPAQUE) 350 MG/ML injection 75 mL (75 mLs Intravenous Contrast Given 07/26/21 1706)  iohexol (OMNIPAQUE) 350 MG/ML injection 80 mL (80 mLs Intravenous Contrast Given 07/26/21 1830)    ED Course/ Medical Decision Making/ A&P Clinical Course as of 07/26/21  1912  Tue Jul 26, 2021  1215 AST(!): 43 [HK]  1215 ALT(!): 54 [HK]  1215 Total Bilirubin(!): 2.2 Higher than priors. [HK]  1215 Troponin I (High Sensitivity): <2 [HK]  [id number] D-Dimer, Quant(!): 3.15 [HK]  1845 US Abdomen Limited RUQ (LIVER/GB) Mass and cirrhosis as seen on prior imaging which is unchanged.  No new abnormalities [HK]    Clinical Course User Index [HK] Delia Heady, PA-C                           Medical Decision Making Amount and/or Complexity of Data Reviewed Labs: ordered. Decision-making details documented in ED Course. Radiology: ordered. Decision-making details documented in ED Course.  Risk Prescription drug management. Decision regarding hospitalization.   56 year old female with a past medical history of autoimmune hepatitis presenting to the ED with a chief complaint of dyspnea on exertion.  Symptoms began yesterday. She will also experience dyspnea when short distances and leaning forward to pick something up off the ground.  Denies any chest pain, abdominal pain or leg swelling.  On exam patient is tachycardic but no signs of respiratory distress.  Lungs are clear bilaterally.  No lower extremity edema, erythema or calf tenderness bilaterally.  Will obtain lab work, chest x-ray and reassess.  EKG shows normal sinus rhythm, no ischemic changes, no STEMI.  Chest x-ray shows hazy opacities that could be due to atelectasis.  Lab work including CMP with some elevation in LFTs which is prior although her new T. bili of 2.2 is new.  CBC is unremarkable.  Lipase is normal here.  Troponin is negative x1.  Due to her complaints of pain on prior visit this morning at PCP right upper quadrant ultrasound was done to evaluate for this new elevation in T. bili.  She has normal appearance of the gallbladder but she does have findings of primary sclerosing cholangitis as well as a mass on the liver.  Both of these things are unchanged from her prior imaging earlier this year.  She  is not tender in the right upper quadrant.  Her D-dimer was elevated to 3.15.  CT angio of the chest shows segmental and subsegmental PEs without evidence of heart strain.  There is also possible lower lobe consolidation.  Patient is afebrile does not have a leukocytosis I feel that her symptoms are likely related to her PEs.  She will need to be admitted to medicine service for management and work-up of her PEs.  I have ordered heparin per pharmacy. Will admit to hospitalist.   Portions of this note were generated with Dragon  dictation software. Dictation errors may occur despite best attempts at proofreading.         Final Clinical Impression(s) / ED Diagnoses Final diagnoses:  Multiple subsegmental pulmonary emboli without acute cor pulmonale [facility])    Rx / DC Orders ED Discharge Orders     None         Delia Heady, PA-C 07/26/21 1912    Margette Fast, MD 07/26/21 2330

## 2021-07-26 NOTE — ED Notes (Signed)
IV attempt x1 right forearm with ultrasound.  Blood obtained for labs, but unable to thread vein.

## 2021-07-26 NOTE — Progress Notes (Addendum)
Patient IV extravasated during injection of contrast Heat applied immediately after and Elevated Nurse and PA notified IV removed to prevent further usage Exstravasation instruction entered

## 2021-07-26 NOTE — ED Triage Notes (Signed)
Pt sent by pcp  due to epigastric pain, had ekg, was told "ok", sent for further evaluation.  Pt reports epigastric/upper abdominal pain since yesterday.  Also reports shortness of breath at times, denies nausea/vomiting.

## 2021-07-26 NOTE — ED Notes (Signed)
EDP is aware of difficult IV access

## 2021-07-26 NOTE — ED Notes (Signed)
Patient in ultrasound.

## 2021-07-27 ENCOUNTER — Encounter (HOSPITAL_COMMUNITY): Payer: Self-pay | Admitting: Family Medicine

## 2021-07-27 ENCOUNTER — Observation Stay (HOSPITAL_BASED_OUTPATIENT_CLINIC_OR_DEPARTMENT_OTHER): Payer: BC Managed Care – PPO

## 2021-07-27 DIAGNOSIS — D696 Thrombocytopenia, unspecified: Secondary | ICD-10-CM | POA: Diagnosis not present

## 2021-07-27 DIAGNOSIS — Z79899 Other long term (current) drug therapy: Secondary | ICD-10-CM | POA: Diagnosis not present

## 2021-07-27 DIAGNOSIS — I2699 Other pulmonary embolism without acute cor pulmonale: Secondary | ICD-10-CM | POA: Diagnosis not present

## 2021-07-27 DIAGNOSIS — R1013 Epigastric pain: Secondary | ICD-10-CM | POA: Diagnosis present

## 2021-07-27 DIAGNOSIS — R0602 Shortness of breath: Secondary | ICD-10-CM

## 2021-07-27 DIAGNOSIS — I10 Essential (primary) hypertension: Secondary | ICD-10-CM

## 2021-07-27 DIAGNOSIS — I2694 Multiple subsegmental pulmonary emboli without acute cor pulmonale: Secondary | ICD-10-CM | POA: Diagnosis not present

## 2021-07-27 DIAGNOSIS — M7989 Other specified soft tissue disorders: Secondary | ICD-10-CM

## 2021-07-27 LAB — CBC WITH DIFFERENTIAL/PLATELET
Abs Immature Granulocytes: 0.03 10*3/uL (ref 0.00–0.07)
Basophils Absolute: 0 10*3/uL (ref 0.0–0.1)
Basophils Relative: 0 %
Eosinophils Absolute: 0 10*3/uL (ref 0.0–0.5)
Eosinophils Relative: 0 %
HCT: 44.9 % (ref 36.0–46.0)
Hemoglobin: 15.5 g/dL — ABNORMAL HIGH (ref 12.0–15.0)
Immature Granulocytes: 0 %
Lymphocytes Relative: 12 %
Lymphs Abs: 1.4 10*3/uL (ref 0.7–4.0)
MCH: 27.7 pg (ref 26.0–34.0)
MCHC: 34.5 g/dL (ref 30.0–36.0)
MCV: 80.2 fL (ref 80.0–100.0)
Monocytes Absolute: 0.9 10*3/uL (ref 0.1–1.0)
Monocytes Relative: 8 %
Neutro Abs: 9.1 10*3/uL — ABNORMAL HIGH (ref 1.7–7.7)
Neutrophils Relative %: 80 %
Platelets: UNDETERMINED 10*3/uL (ref 150–400)
RBC: 5.6 MIL/uL — ABNORMAL HIGH (ref 3.87–5.11)
RDW: 15.9 % — ABNORMAL HIGH (ref 11.5–15.5)
WBC: 11.5 10*3/uL — ABNORMAL HIGH (ref 4.0–10.5)
nRBC: 0 % (ref 0.0–0.2)

## 2021-07-27 LAB — ECHOCARDIOGRAM COMPLETE
AV Mean grad: 2 mmHg
AV Peak grad: 3.6 mmHg
Ao pk vel: 0.95 m/s
Area-P 1/2: 9.85 cm2
Height: 67 in
S' Lateral: 3 cm
Weight: 2938.29 oz

## 2021-07-27 LAB — COMPREHENSIVE METABOLIC PANEL
ALT: 50 U/L — ABNORMAL HIGH (ref 0–44)
AST: 41 U/L (ref 15–41)
Albumin: 3.7 g/dL (ref 3.5–5.0)
Alkaline Phosphatase: 125 U/L (ref 38–126)
Anion gap: 10 (ref 5–15)
BUN: 7 mg/dL (ref 6–20)
CO2: 19 mmol/L — ABNORMAL LOW (ref 22–32)
Calcium: 8.9 mg/dL (ref 8.9–10.3)
Chloride: 107 mmol/L (ref 98–111)
Creatinine, Ser: 0.62 mg/dL (ref 0.44–1.00)
GFR, Estimated: >60 mL/min (ref >60)
Glucose, Bld: 150 mg/dL — ABNORMAL HIGH (ref 70–99)
Potassium: 3.5 mmol/L (ref 3.5–5.1)
Sodium: 136 mmol/L (ref 135–145)
Total Bilirubin: 1.8 mg/dL — ABNORMAL HIGH (ref 0.3–1.2)
Total Protein: 7.6 g/dL (ref 6.5–8.1)

## 2021-07-27 LAB — HEPARIN LEVEL (UNFRACTIONATED)
Heparin Unfractionated: 0.91 IU/mL — ABNORMAL HIGH (ref 0.30–0.70)
Heparin Unfractionated: 0.25 IU/mL — ABNORMAL LOW (ref 0.30–0.70)
Heparin Unfractionated: 0.34 IU/mL (ref 0.30–0.70)

## 2021-07-27 LAB — PROCALCITONIN: Procalcitonin: <0.1 ng/mL

## 2021-07-27 LAB — HIV ANTIBODY (ROUTINE TESTING W REFLEX): HIV Screen 4th Generation wRfx: NONREACTIVE

## 2021-07-27 MED ORDER — HEPARIN (PORCINE) 25000 UT/250ML-% IV SOLN
1200.0000 [IU]/h | INTRAVENOUS | Status: DC
  Filled 2021-07-27 (×2): qty 250

## 2021-07-27 MED ORDER — ACETAMINOPHEN 650 MG RE SUPP: 650.0000 mg | Freq: Four times a day (QID) | RECTAL | Status: DC | PRN

## 2021-07-27 MED ORDER — PANTOPRAZOLE SODIUM 40 MG PO TBEC
40.0000 mg | DELAYED_RELEASE_TABLET | Freq: Every day | ORAL | Status: DC
  Administered 2021-07-27 – 2021-07-28 (×2): 40 mg via ORAL
  Filled 2021-07-27 (×2): qty 1

## 2021-07-27 MED ORDER — FENTANYL CITRATE PF 50 MCG/ML IJ SOSY: 50.0000 ug | PREFILLED_SYRINGE | INTRAMUSCULAR | Status: DC | PRN

## 2021-07-27 MED ORDER — FENTANYL CITRATE PF 50 MCG/ML IJ SOSY
50.0000 ug | PREFILLED_SYRINGE | INTRAMUSCULAR | Status: DC | PRN
  Administered 2021-07-27: 50 ug via INTRAVENOUS
  Filled 2021-07-27: qty 1

## 2021-07-27 MED ORDER — AMLODIPINE BESYLATE 5 MG PO TABS
5.0000 mg | ORAL_TABLET | Freq: Every day | ORAL | Status: DC
  Administered 2021-07-27 – 2021-07-28 (×2): 5 mg via ORAL
  Filled 2021-07-27 (×2): qty 1

## 2021-07-27 MED ORDER — OXYCODONE HCL 5 MG PO TABS
5.0000 mg | ORAL_TABLET | ORAL | Status: DC | PRN
  Administered 2021-07-27 – 2021-07-28 (×4): 5 mg via ORAL
  Filled 2021-07-27 (×4): qty 1

## 2021-07-27 MED ORDER — ACETAMINOPHEN 325 MG PO TABS: 650.0000 mg | ORAL_TABLET | Freq: Four times a day (QID) | ORAL | Status: DC | PRN

## 2021-07-27 MED ORDER — SENNOSIDES-DOCUSATE SODIUM 8.6-50 MG PO TABS: 1.0000 | ORAL_TABLET | Freq: Every evening | ORAL | Status: DC | PRN

## 2021-07-27 MED ORDER — PERFLUTREN LIPID MICROSPHERE
1.0000 mL | INTRAVENOUS | Status: AC | PRN
  Administered 2021-07-27: 2 mL via INTRAVENOUS

## 2021-07-27 NOTE — Progress Notes (Signed)
ANTICOAGULATION CONSULT NOTE   Pharmacy Consult for IV heparin Indication: pulmonary embolus  No Known Allergies  Patient Measurements: Height: 5\' 7"  (170.2 cm) Weight: 84.4 kg (186 lb) IBW/kg (Calculated) : 61.6 Heparin Dosing Weight: 79.2 kg   Vital Signs: Temp: 98 F (36.7 C) (06/07 0634) Temp Source: Oral (06/07 0634) BP: 147/102 (06/07 0634) Pulse Rate: 91 (06/07 0634)  Labs: Recent Labs    07/26/21 1111 07/26/21 1112 07/26/21 1210 07/26/21 1939 07/27/21 0511  HGB  --   --  15.3*  --   --   HCT  --   --  43.1  --   --   PLT  --   --  130*  --   --   LABPROT  --   --   --  15.8*  --   INR  --   --   --  1.3*  --   HEPARINUNFRC  --   --   --   --  0.91*  CREATININE 0.53  --   --   --   --   TROPONINIHS  --  <2  --   --   --      Estimated Creatinine Clearance: 89.7 mL/min (by C-G formula based on SCr of 0.53 mg/dL).  Assessment: 33 YOF presenting to 59 c/o SOB. There is concern for pulmonary embolism, but unable to obtain CT at this time to confirm. Pharmacy has been consulted for heparin dosing.   Patient noted to have difficulties with IV access per chart review. No anticoagulation PTA.   6/7 AM update:  CT confirmed PE Heparin level elevated  Goal of Therapy:  Heparin level 0.3-0.7 units/ml Monitor platelets by anticoagulation protocol: Yes   Plan:  Dec heparin to 1100 units/hr 1400 heparin level  8/7, PharmD, BCPS Clinical Pharmacist Phone: [phone]

## 2021-07-27 NOTE — H&P (Signed)
History and Physical    Patient: [name] Short [id number] DOB: [birth year] DOA: 07/26/2021 DOS: the patient was seen and examined on 07/27/2021 PCP: Kathreen Devoid, PA-C  Patient coming from: Home  Chief Complaint:  Chief Complaint  Patient presents with   Abdominal Pain    Epigastric pain   Shortness of Breath   HPI: [name] Short is a 56 y.o. female with medical history significant of autoimmune hepatitis, cirrhosis, primary sclerosing cholangitis. Presenting with dyspnea on exertion. Her symptoms started 3 nights ago with a sharp RUQ ab/right inferior axillary pain. She took APAP and it improved. By the morning, it was just a dull pain that she felt she could work through. However, she has found herself more short of breath with activity over the last 2 days. She has also had cough during that time. There was no chest pain, palpitations, syncopal episodes, fever, or sick contacts. When her symptoms did not improve yesterday, she decided to go to the ED for evaluation. She denies any other aggravating or alleviating factors.    Review of Systems: As mentioned in the history of present illness. All other systems reviewed and are negative. Past Medical History:  Diagnosis Date   Allergic rhinosinusitis    Anemia    Autoimmune hepatitis (HCC)    cholangitis overlap syndrome, sees Dr. Carlean Purl    H/O high risk medication treatment    azathioprine/prednisone, normal TPMT genotype   History of pruritus of skin    Hx of hyperglycemia    when on prednisone   Primary sclerosing cholangitis    Sickle cell trait ([place])    ? when testing done   Vitamin D deficiency    Past Surgical History:  Procedure Laterality Date   ERCP  04/26/2004   [facility] in [address], Dr. Sanjuana Letters   Flexible Proctosigmoidoscopy  01/25/2005   LIVER BIOPSY     TUBAL LIGATION     Social History:  reports that she has never smoked. She has never used smokeless tobacco. She reports that she does  not drink alcohol and does not use drugs.  No Known Allergies  Family History  Problem Relation Age of Onset   Hypertension Mother    Hypertension Father    Diabetes Mother    Diabetes Father    Colon cancer Other        father's aunt    Prior to Admission medications   Medication Sig Start Date End Date Taking? Authorizing Provider  azaTHIOprine (IMURAN) 50 MG tablet Take 150 mg by mouth daily. 01/08/12   Gatha Mayer, MD  Calcium Carbonate-Vitamin D (CALTRATE 600+D PO) Take 1 tablet by mouth daily.    [provider]  Multiple Vitamin (MULTIVITAMIN) tablet Take 1 tablet by mouth daily.    [provider]  omeprazole (PRILOSEC) 20 MG capsule Take 20 mg by mouth daily. 07/07/21   [provider]    Physical Exam: Vitals:   07/27/21 0400 07/27/21 0430 07/27/21 0500 07/27/21 0634  BP: (!) 136/98 (!) 138/103 (!) 129/101 (!) 147/102  Pulse: 91 89 90 91  Resp: (!) 24 (!) 21 (!) 22 19  Temp:    98 F (36.7 C)  TempSrc:    Oral  SpO2: 97% 97% 97% 99%  Weight:    83.3 kg  Height:    '5\' 7"'  (1.702 m)   General: 56 y.o. female resting in bed in NAD Eyes: PERRL, normal sclera ENMT: Nares patent w/o discharge, orophaynx clear,  dentition normal, ears w/o discharge/lesions/ulcers Neck: Supple, trachea midline Cardiovascular: RRR, +S1, S2, no m/g/r, equal pulses throughout Respiratory: CTABL, no w/r/r, normal WOB GI: BS+, NDNT, no masses noted, no organomegaly noted MSK: No e/c/c Skin: No rashes, bruises, ulcerations noted Neuro: A&O x 3, no focal deficits Psyc: Appropriate interaction and affect, calm/cooperative  Data Reviewed:  Na+  137 CO2  20 Scr  0.53 Alk phos  128 Lipase  24 AST  43 ALT  54 T bili  2.2 Trp  <2 WBC  8.8 Hgb  15.3 Plt  130 D-dimer  3.15   CTA chest: 1. Segmental and subsegmental pulmonary arteries of the left upper, left lower, right upper, right lower lobes. No findings of right heart strain or pulmonary  infarction. 2. Bilateral lower lobe patchy consolidation. Finding could represent a combination of atelectasis and/or infection/inflammation. Limited evaluation due to timing of contrast. 3. Trace bilateral pleural effusions.  RUQ Ab [place] 1. Normal appearance of the gallbladder. 2. 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 3. An echogenic mass measured within the anterior left hepatic lobe is favored to represent a region of enhancing fibrosis and sclerosing parenchymal contraction with segmental biliary ductal dilatation in a similar region better seen on prior MRI 05/18/2021.  Assessment and Plan: B/l Pulmonary Emboli     - placed in obs, tele     - continue heparin gtt; she has not yet made decision on oral anticoagulation     - check echo     - CT as above; will check procal d/t question of BLL infection     - IS  Autoimmune Hepatitis Cirrhosis Primary sclerosing cholangitis     - she has chronically elevated LFTs d/t to these conditions     - RUQ [place] as above     - continue home regimen  Thrombocytopenia     - chart review shows chronic mild thrombocytopenia; likely secondary to above     - monitor while on heparin gtt  HTN     - no previous diagnosis per her recollection     - she has elevation of diastolic pressures     - will add low dose amlodipine; monitor   Advance Care Planning:   Code Status: FULL  Consults: None  Family Communication: None at bedside  Severity of Illness: The appropriate patient status for this patient is INPATIENT. Inpatient status is judged to be reasonable and necessary in order to provide the required intensity of service to ensure the patient's safety. The patient's presenting symptoms, physical exam findings, and initial radiographic and laboratory data in the context of their chronic comorbidities is felt to place them at high risk for further clinical  deterioration. Furthermore, it is not anticipated that the patient will be medically stable for discharge from the hospital within 2 midnights of admission.   * I certify that at the point of admission it is my clinical judgment that the patient will require inpatient hospital care spanning beyond 2 midnights from the point of admission due to high intensity of service, high risk for further deterioration and high frequency of surveillance required.*  Author: Jonnie Finner, DO 07/27/2021 7:09 AM  For on call review [email or web address].

## 2021-07-27 NOTE — ED Notes (Signed)
Lab called and advised that medical courier would be contacted for heparin level lab.

## 2021-07-27 NOTE — Progress Notes (Signed)
Bilateral lower extremity venous duplex has been completed. Preliminary results can be found in CV Proc through chart review.   07/27/21 8:59 AM Olen Cordial RVT

## 2021-07-27 NOTE — Progress Notes (Signed)
ANTICOAGULATION CONSULT NOTE   Pharmacy Consult for IV heparin Indication: pulmonary embolus  No Known Allergies  Patient Measurements: Height: 5\' 7"  (170.2 cm) Weight: 83.3 kg (183 lb 10.3 oz) IBW/kg (Calculated) : 61.6 Heparin Dosing Weight: 79.2 kg   Vital Signs: Temp: 98 F (36.7 C) (06/07 0634) Temp Source: Oral (06/07 0634) BP: 147/102 (06/07 0634) Pulse Rate: 91 (06/07 0634)  Labs: Recent Labs    07/26/21 1111 07/26/21 1112 07/26/21 1210 07/26/21 1939 07/27/21 0511 07/27/21 0833 07/27/21 1329  HGB  --   --  15.3*  --   --  15.5*  --   HCT  --   --  43.1  --   --  44.9  --   PLT  --   --  130*  --   --  PLATELET CLUMPS NOTED ON SMEAR, UNABLE TO ESTIMATE  --   LABPROT  --   --   --  15.8*  --   --   --   INR  --   --   --  1.3*  --   --   --   HEPARINUNFRC  --   --   --   --  0.91*  --  0.25*  CREATININE 0.53  --   --   --   --   --  0.62  TROPONINIHS  --  <2  --   --   --   --   --      Estimated Creatinine Clearance: 89.2 mL/min (by C-G formula based on SCr of 0.62 mg/dL).  Assessment: 26 YOF presenting to 59 c/o SOB. There is concern for pulmonary embolism, but unable to obtain CT at this time to confirm. Pharmacy has been consulted for heparin dosing.   Patient noted to have difficulties with IV access per chart review. No anticoagulation PTA.   Today, 07/27/21  HL 0.25, subtherapeutic  Hgb 15.5, plt 130 on 6/6  No line or bleeding issues per RN    Goal of Therapy:  Heparin level 0.3-0.7 units/ml Monitor platelets by anticoagulation protocol: Yes   Plan:  Increase heparin drip to 1200 units/hr  Obtain HL 6 hours after rate change  Daily HL, CBC while on heparin drip Monitor for signs and symptoms of bleeding  09/26/21, PharmD, BCPS 07/27/2021 3:05 PM

## 2021-07-27 NOTE — Progress Notes (Signed)
ANTICOAGULATION CONSULT NOTE   Pharmacy Consult for IV heparin Indication: pulmonary embolus  No Known Allergies  Patient Measurements: Height: 5\' 7"  (170.2 cm) Weight: 83.3 kg (183 lb 10.3 oz) IBW/kg (Calculated) : 61.6 Heparin Dosing Weight: 79.2 kg   Vital Signs: Temp: 98.3 F (36.8 C) (06/07 2002) Temp Source: Oral (06/07 2002) BP: 122/92 (06/07 2002) Pulse Rate: 108 (06/07 2002)  Labs: Recent Labs    07/26/21 1111 07/26/21 1112 07/26/21 1210 07/26/21 1939 07/27/21 0511 07/27/21 0833 07/27/21 1329 07/27/21 2149  HGB  --   --  15.3*  --   --  15.5*  --   --   HCT  --   --  43.1  --   --  44.9  --   --   PLT  --   --  130*  --   --  PLATELET CLUMPS NOTED ON SMEAR, UNABLE TO ESTIMATE  --   --   LABPROT  --   --   --  15.8*  --   --   --   --   INR  --   --   --  1.3*  --   --   --   --   HEPARINUNFRC  --   --   --   --  0.91*  --  0.25* 0.34  CREATININE 0.53  --   --   --   --   --  0.62  --   TROPONINIHS  --  <2  --   --   --   --   --   --      Estimated Creatinine Clearance: 89.2 mL/min (by C-G formula based on SCr of 0.62 mg/dL).  Assessment: 28 YOF presenting to 59 c/o SOB. There is concern for pulmonary embolism, but unable to obtain CT at this time to confirm. Pharmacy has been consulted for heparin dosing.   Patient noted to have difficulties with IV access per chart review. No anticoagulation PTA.   Today, 07/27/21  Heparin level 0.34, now therapeutic on IV heparin 1200 units/hr No line or bleeding issues per RN   Goal of Therapy:  Heparin level 0.3-0.7 units/ml Monitor platelets by anticoagulation protocol: Yes   Plan:  Continue heparin drip at 1200 units/hr  Daily HL, CBC while on heparin drip Monitor for signs and symptoms of bleeding  09/26/21, PharmD, BCPS 07/27/2021 10:57 PM

## 2021-07-28 ENCOUNTER — Other Ambulatory Visit: Payer: Self-pay | Admitting: Internal Medicine

## 2021-07-28 ENCOUNTER — Other Ambulatory Visit (HOSPITAL_COMMUNITY): Payer: Self-pay

## 2021-07-28 DIAGNOSIS — K743 Primary biliary cirrhosis: Secondary | ICD-10-CM | POA: Diagnosis not present

## 2021-07-28 DIAGNOSIS — K754 Autoimmune hepatitis: Secondary | ICD-10-CM

## 2021-07-28 DIAGNOSIS — I10 Essential (primary) hypertension: Secondary | ICD-10-CM

## 2021-07-28 DIAGNOSIS — I2694 Multiple subsegmental pulmonary emboli without acute cor pulmonale: Secondary | ICD-10-CM

## 2021-07-28 DIAGNOSIS — I2699 Other pulmonary embolism without acute cor pulmonale: Secondary | ICD-10-CM | POA: Diagnosis not present

## 2021-07-28 DIAGNOSIS — D696 Thrombocytopenia, unspecified: Secondary | ICD-10-CM

## 2021-07-28 DIAGNOSIS — K8301 Primary sclerosing cholangitis: Secondary | ICD-10-CM

## 2021-07-28 LAB — COMPREHENSIVE METABOLIC PANEL
ALT: 61 U/L — ABNORMAL HIGH (ref 0–44)
AST: 44 U/L — ABNORMAL HIGH (ref 15–41)
Albumin: 3.5 g/dL (ref 3.5–5.0)
Alkaline Phosphatase: 120 U/L (ref 38–126)
Anion gap: 9 (ref 5–15)
BUN: 7 mg/dL (ref 6–20)
CO2: 24 mmol/L (ref 22–32)
Calcium: 9.3 mg/dL (ref 8.9–10.3)
Chloride: 104 mmol/L (ref 98–111)
Creatinine, Ser: 0.68 mg/dL (ref 0.44–1.00)
GFR, Estimated: >60 mL/min (ref >60)
Glucose, Bld: 110 mg/dL — ABNORMAL HIGH (ref 70–99)
Potassium: 3.7 mmol/L (ref 3.5–5.1)
Sodium: 137 mmol/L (ref 135–145)
Total Bilirubin: 1.8 mg/dL — ABNORMAL HIGH (ref 0.3–1.2)
Total Protein: 7.4 g/dL (ref 6.5–8.1)

## 2021-07-28 LAB — HEPARIN LEVEL (UNFRACTIONATED): Heparin Unfractionated: 0.28 IU/mL — ABNORMAL LOW (ref 0.30–0.70)

## 2021-07-28 LAB — CBC
HCT: 41.8 % (ref 36.0–46.0)
Hemoglobin: 14.7 g/dL (ref 12.0–15.0)
MCH: 27.1 pg (ref 26.0–34.0)
MCHC: 35.2 g/dL (ref 30.0–36.0)
MCV: 77.1 fL — ABNORMAL LOW (ref 80.0–100.0)
Platelets: 149 10*3/uL — ABNORMAL LOW (ref 150–400)
RBC: 5.42 MIL/uL — ABNORMAL HIGH (ref 3.87–5.11)
RDW: 14.6 % (ref 11.5–15.5)
WBC: 10.1 10*3/uL (ref 4.0–10.5)
nRBC: 0 % (ref 0.0–0.2)

## 2021-07-28 MED ORDER — APIXABAN 5 MG PO TABS: 5.0000 mg | ORAL_TABLET | Freq: Two times a day (BID) | ORAL | Status: DC

## 2021-07-28 MED ORDER — HEPARIN (PORCINE) 25000 UT/250ML-% IV SOLN
1300.0000 [IU]/h | INTRAVENOUS | Status: DC
  Administered 2021-07-28: 1300 [IU]/h via INTRAVENOUS
  Filled 2021-07-28: qty 250

## 2021-07-28 MED ORDER — APIXABAN 5 MG PO TABS
10.0000 mg | ORAL_TABLET | Freq: Two times a day (BID) | ORAL | Status: DC
  Administered 2021-07-28: 10 mg via ORAL
  Filled 2021-07-28: qty 2

## 2021-07-28 MED ORDER — APIXABAN (ELIQUIS) VTE STARTER PACK (10MG AND 5MG): ORAL_TABLET | ORAL | 0 refills | Status: AC

## 2021-07-28 MED ORDER — APIXABAN (ELIQUIS) VTE STARTER PACK (10MG AND 5MG)
ORAL_TABLET | ORAL | 0 refills | Status: AC
Start: 2021-07-28 — End: ?
  Filled 2021-07-28: qty 74, 28d supply, fill #0

## 2021-07-28 MED ORDER — AMLODIPINE BESYLATE 5 MG PO TABS: 5.0000 mg | ORAL_TABLET | Freq: Every day | ORAL | 0 refills | Status: AC

## 2021-07-28 MED ORDER — APIXABAN (ELIQUIS) EDUCATION KIT FOR DVT/PE PATIENTS: PACK | Freq: Once | Status: DC

## 2021-07-28 MED ORDER — ACETAMINOPHEN 325 MG PO TABS: 650.0000 mg | ORAL_TABLET | Freq: Four times a day (QID) | ORAL | Status: AC | PRN

## 2021-07-28 NOTE — Discharge Instructions (Signed)
Information on my medicine - ELIQUIS (apixaban)  This medication education was reviewed with me or my healthcare representative as part of my discharge preparation.  The pharmacist that spoke with me during my hospital stay was:  Barnet Pall, Student-PharmD  Why was Eliquis prescribed for you? Eliquis was prescribed to treat blood clots that may have been found in the veins of your legs (deep vein thrombosis) or in your lungs (pulmonary embolism) and to reduce the risk of them occurring again.  What do You need to know about Eliquis ? The starting dose is 10 mg (two 5 mg tablets) taken TWICE daily for the FIRST SEVEN (7) DAYS, then on (enter date)  08/04/21  the dose is reduced to ONE 5 mg tablet taken TWICE daily.  Eliquis may be taken with or without food.   Try to take the dose about the same time in the morning and in the evening. If you have difficulty swallowing the tablet whole please discuss with your pharmacist how to take the medication safely.  Take Eliquis exactly as prescribed and DO NOT stop taking Eliquis without talking to the doctor who prescribed the medication.  Stopping may increase your risk of developing a new blood clot.  Refill your prescription before you run out.  After discharge, you should have regular check-up appointments with your healthcare provider that is prescribing your Eliquis.    What do you do if you miss a dose? If a dose of ELIQUIS is not taken at the scheduled time, take it as soon as possible on the same day and twice-daily administration should be resumed. The dose should not be doubled to make up for a missed dose.  Important Safety Information A possible side effect of Eliquis is bleeding. You should call your healthcare provider right away if you experience any of the following: Bleeding from an injury or your nose that does not stop. Unusual colored urine (red or dark brown) or unusual colored stools (red or black). Unusual bruising  for unknown reasons. A serious fall or if you hit your head (even if there is no bleeding).  Some medicines may interact with Eliquis and might increase your risk of bleeding or clotting while on Eliquis. To help avoid this, consult your healthcare provider or pharmacist prior to using any new prescription or non-prescription medications, including herbals, vitamins, non-steroidal anti-inflammatory drugs (NSAIDs) and supplements.  This website has more information on Eliquis (apixaban): http://www.eliquis.com/eliquis/home

## 2021-07-28 NOTE — Progress Notes (Signed)
  Transition of Care [facility]) Screening Note   Patient Details  Name: [name] Short Date of Birth: [birth year]   Transition of Care [facility]) CM/SW Contact:    Amada Jupiter, LCSW Phone Number: 07/28/2021, 9:46 AM    Transition of Care Department [facility]) has reviewed patient and no TOC needs have been identified at this time. We will continue to monitor patient advancement through interdisciplinary progression rounds. If new patient transition needs arise, please place a TOC consult.

## 2021-07-28 NOTE — Progress Notes (Signed)
ANTICOAGULATION CONSULT NOTE   Pharmacy Consult for IV heparin Indication: pulmonary embolus  No Known Allergies  Patient Measurements: Height: 5\' 7"  (170.2 cm) Weight: 83.3 kg (183 lb 10.3 oz) IBW/kg (Calculated) : 61.6 Heparin Dosing Weight: 79.2 kg   Vital Signs: Temp: 98.5 F (36.9 C) (06/08 0436) Temp Source: Oral (06/08 0436) BP: 133/87 (06/08 0436) Pulse Rate: 106 (06/08 0436)  Labs: Recent Labs     0000 07/26/21 1111 07/26/21 1112 07/26/21 1210 07/26/21 1939 07/27/21 0511 07/27/21 0833 07/27/21 1329 07/27/21 2149 07/28/21 0617  HGB   < >  --   --  15.3*  --   --  15.5*  --   --  14.7  HCT  --   --   --  43.1  --   --  44.9  --   --  41.8  PLT  --   --   --  130*  --   --  PLATELET CLUMPS NOTED ON SMEAR, UNABLE TO ESTIMATE  --   --  149*  LABPROT  --   --   --   --  15.8*  --   --   --   --   --   INR  --   --   --   --  1.3*  --   --   --   --   --   HEPARINUNFRC  --   --   --   --   --    < >  --  0.25* 0.34 0.28*  CREATININE  --  0.53  --   --   --   --   --  0.62  --  0.68  TROPONINIHS  --   --  <2  --   --   --   --   --   --   --    < > = values in this interval not displayed.     Estimated Creatinine Clearance: 89.2 mL/min (by C-G formula based on SCr of 0.68 mg/dL).  Assessment: 6 YOF presenting to [employer] c/o SOB. There is concern for pulmonary embolism, but unable to obtain CT at this time to confirm. Pharmacy has been consulted for heparin dosing.   Patient noted to have difficulties with IV access per chart review. No anticoagulation PTA.   6/6 CTA: B PE 6/7 LE dopplers neg DVT  Today, 07/28/21  Heparin level 0.28, slightly sub-therapeutic on IV heparin 1200 units/hr CBC stable, PLT low @ 149 No line or bleeding issues per RN   Goal of Therapy:  Heparin level 0.3-0.7 units/ml Monitor platelets by anticoagulation protocol: Yes   Plan:  Increase heparin drip to 1300 units/hr and check 6 hour heparin level Daily HL, CBC  while on heparin drip Monitor for signs and symptoms of bleeding F/u transition to [place], Pharm.D 07/28/2021 7:31 AM

## 2021-07-29 DIAGNOSIS — K743 Primary biliary cirrhosis: Secondary | ICD-10-CM

## 2021-07-29 DIAGNOSIS — D571 Sickle-cell disease without crisis: Secondary | ICD-10-CM

## 2021-07-29 NOTE — Discharge Summary (Signed)
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  She presented with dyspnea on exertion and was found to have bilateral PEs on CTA that was performed in the ED.  She was started on a heparin infusion.  Principal Problem:   Acute pulmonary embolism (HCC) History of irregular menses -2D echo was performed however pictures are poor - She is hemodynamically stable today and has been transitioned to Eliquis - Pharmacy has done Eliquis teaching - I have recommended that she speak with her primary care physician about discontinuing Depo-Medrol which can precipitate thrombosis  Active Problems: Autoimmune hepatitis, primary sclerosing cholangitis with cirrhosis Continue follow-up with GI at [facility]  Sickle cell anemia versus trait - The patient states that she has sickle cell disease but has never had a sickle cell crisis  Acute thrombocytopenia (HCC) -Mild-follow    HTN (hypertension) -Continue amlodipine     Discharge Instructions  Discharge Instructions     Diet - low sodium heart healthy   Complete by: As directed    Increase activity slowly   Complete by: As directed       Allergies as of 07/28/2021   No Known Allergies      Medication List     STOP taking these medications    medroxyPROGESTERone 150 MG/ML injection Commonly known as: DEPO-PROVERA       TAKE these medications     acetaminophen 325 MG tablet Commonly known as: TYLENOL Take 325 mg by mouth every 6 (six) hours as needed for moderate pain. What changed: Another medication with the same name was added. Make sure you understand how and when to take each.   acetaminophen 325 MG tablet Commonly known as: TYLENOL Take 2 tablets (650 mg total) by mouth every 6 (six) hours as needed for mild pain (or Fever >/= 101). What changed: You were already taking a medication with the same name, and this prescription was added. Make sure you understand how and when to take each.   amLODipine 5 MG tablet Commonly known as: NORVASC Take 1 tablet (5 mg total) by mouth daily.   Apixaban Starter Pack (10mg  and 5mg ) Commonly known as: ELIQUIS STARTER PACK Take as directed on package: start with two-5mg  tablets twice daily for 7 days. On day 8, switch to one-5mg  tablet twice daily.   omeprazole 20 MG capsule Commonly known as: PRILOSEC Take 20 mg by mouth daily.        Follow-up Information     , MD. Schedule an appointment as soon as possible for a visit in 1 week(s).   Specialty: Hematology and Oncology Contact information: [address] [place] [address] [place] [phone]                    The results of significant diagnostics from this hospitalization (including imaging, microbiology, ancillary and laboratory) are listed below for reference.    VAS [id number] LOWER EXTREMITY VENOUS (DVT)  Result Date: 07/27/2021  Lower Venous DVT Study Patient Name:  [name] Short  Date of Exam:   07/27/2021 Medical Rec #: [id number]        Accession #:    [id number] Date of Birth: 31-Oct-1965        Patient Gender: F Patient Age:   [age] years [facility] Procedure:      VAS US LOWER EXTREMITY VENOUS (DVT) Referring Phys: Margie EgeYRONE KYLE --------------------------------------------------------------------------------  Indications: Swelling.  Risk Factors: None identified.  Anticoagulation: Heparin. Comparison Study: No prior studies. Performing Technologist: Chanda BusingGregory Collins RVT  Examination Guidelines: A complete evaluation includes B-mode imaging, spectral Doppler, color Doppler, and power Doppler as needed of all accessible portions of each vessel. Bilateral testing is considered an integral part of a complete examination. Limited examinations for reoccurring indications may be performed as noted. The reflux portion of the exam is performed with the patient in reverse Trendelenburg.  +---------+---------------+---------+-----------+----------+--------------+ RIGHT    CompressibilityPhasicitySpontaneityPropertiesThrombus Aging +---------+---------------+---------+-----------+----------+--------------+ CFV      Full           Yes      Yes                                 +---------+---------------+---------+-----------+----------+--------------+ SFJ      Full                                                        +---------+---------------+---------+-----------+----------+--------------+ FV Prox  Full                                                        +---------+---------------+---------+-----------+----------+--------------+ FV Mid   Full                                                        +---------+---------------+---------+-----------+----------+--------------+ FV DistalFull                                                        +---------+---------------+---------+-----------+----------+--------------+ PFV      Full                                                        +---------+---------------+---------+-----------+----------+--------------+ POP      Full           Yes      Yes                                 +---------+---------------+---------+-----------+----------+--------------+ PTV      Full                                                         +---------+---------------+---------+-----------+----------+--------------+  PERO     Full                                                        +---------+---------------+---------+-----------+----------+--------------+   +---------+---------------+---------+-----------+----------+--------------+ LEFT     CompressibilityPhasicitySpontaneityPropertiesThrombus Aging +---------+---------------+---------+-----------+----------+--------------+ CFV      Full           Yes      Yes                                 +---------+---------------+---------+-----------+----------+--------------+ SFJ      Full                                                        +---------+---------------+---------+-----------+----------+--------------+ FV Prox  Full                                                        +---------+---------------+---------+-----------+----------+--------------+ FV Mid   Full                                                        +---------+---------------+---------+-----------+----------+--------------+ FV DistalFull                                                        +---------+---------------+---------+-----------+----------+--------------+ PFV      Full                                                        +---------+---------------+---------+-----------+----------+--------------+ POP      Full           Yes      Yes                                 +---------+---------------+---------+-----------+----------+--------------+ PTV      Full                                                        +---------+---------------+---------+-----------+----------+--------------+ PERO     Full                                                        +---------+---------------+---------+-----------+----------+--------------+  Summary: RIGHT: - There is no evidence of deep vein thrombosis in the lower extremity.  - No cystic structure found in  the popliteal fossa.  LEFT: - There is no evidence of deep vein thrombosis in the lower extremity.  - No cystic structure found in the popliteal fossa.  *See table(s) above for measurements and observations. Electronically signed by Coral Else MD on 07/27/2021 at 10:53:02 PM.    Final    ECHOCARDIOGRAM COMPLETE  Result Date: 07/27/2021    ECHOCARDIOGRAM REPORT   Patient Name:   [name] Short [facility] Date of Exam: 07/27/2021 Medical Rec #:  [id number]       Height:       67.0 in Accession #:    [id number]      Weight:       183.6 lb Date of Birth:  1965/10/08       BSA:          1.950 m Patient Age:    56 years        BP:           147/102 mmHg Patient Gender: F               HR:           89 bpm. Exam Location:  Inpatient Procedure: 2D Echo, Cardiac Doppler, Color Doppler and Intracardiac            Opacification Agent Indications:    Pulmonary embolus  History:        Patient has no prior history of Echocardiogram examinations.  Sonographer:    Neomia Dear RDCS Referring Phys: [id number] Teddy Spike  Sonographer Comments: Technically difficult study due to poor echo windows, no apical window, no subcostal window, suboptimal parasternal window and patient is morbidly obese. Image acquisition challenging due to patient body habitus. IMPRESSIONS  1. Very limited study due to poor echo windows.  2. Left ventricular ejection fraction, by estimation, is 60 to 65%. The left ventricle has normal function. The left ventricle has no regional wall motion abnormalities. Left ventricular diastolic parameters are consistent with Grade I diastolic dysfunction (impaired relaxation).  3. Right ventricular systolic function was not well visualized. The right ventricular size is not well visualized.  4. The mitral valve was not well visualized.  5. The aortic valve is tricuspid. Aortic valve regurgitation is not visualized. No aortic stenosis is present. Comparison(s): No prior Echocardiogram. FINDINGS  Left Ventricle: Left ventricular  ejection fraction, by estimation, is 60 to 65%. The left ventricle has normal function. The left ventricle has no regional wall motion abnormalities. Definity contrast agent was given IV to delineate the left ventricular  endocardial borders. The left ventricular internal cavity size was normal in size. There is no left ventricular hypertrophy. Left ventricular diastolic parameters are consistent with Grade I diastolic dysfunction (impaired relaxation). Right Ventricle: The right ventricular size is not well visualized. Right vetricular wall thickness was not well visualized. Right ventricular systolic function was not well visualized. Left Atrium: Left atrial size was not well visualized. Right Atrium: Right atrial size was not well visualized. Pericardium: There is no evidence of pericardial effusion. Mitral Valve: The mitral valve was not well visualized. MV peak gradient, 1.9 mmHg. The mean mitral valve gradient is 1.0 mmHg. Tricuspid Valve: The tricuspid valve is normal in structure. Tricuspid valve regurgitation is trivial. Aortic Valve: The aortic valve is tricuspid. Aortic valve regurgitation is not visualized. No aortic stenosis is present. Aortic valve mean gradient  measures 2.0 mmHg. Aortic valve peak gradient measures 3.6 mmHg. Pulmonic Valve: The pulmonic valve was normal in structure. Pulmonic valve regurgitation is trivial. Aorta: The aortic root and ascending aorta are structurally normal, with no evidence of dilitation. Venous: The inferior vena cava was not well visualized. IAS/Shunts: The interatrial septum was not well visualized.  LEFT VENTRICLE PLAX 2D LVIDd:         4.40 cm LVIDs:         3.00 cm LV PW:         1.00 cm LV IVS:        0.80 cm  LEFT ATRIUM         Index LA diam:    2.80 cm 1.44 cm/m  AORTIC VALVE              PULMONIC VALVE AV Vmax:      94.50 cm/s  PV Vmax:       0.85 m/s AV Vmean:     63.500 cm/s PV Vmean:      61.300 cm/s AV VTI:       0.142 m     PV VTI:        0.151 m AV  Peak Grad: 3.6 mmHg    PV Peak grad:  2.9 mmHg AV Mean Grad: 2.0 mmHg    PV Mean grad:  2.0 mmHg  AORTA Ao Asc diam: 2.90 cm MITRAL VALVE               TRICUSPID VALVE MV Area (PHT): 9.85 cm    TR Peak grad:   22.7 mmHg MV Peak grad:  1.9 mmHg    TR Vmax:        238.00 cm/s MV Mean grad:  1.0 mmHg MV Vmax:       0.70 m/s MV Vmean:      48.9 cm/s MV Decel Time: 77 msec MV E velocity: 37.60 cm/s MV A velocity: 64.50 cm/s MV E/A ratio:  0.58 Laurance Flatten MD Electronically signed by Laurance Flatten MD Signature Date/Time: 07/27/2021/12:56:01 PM    Final    CT Angio Chest PE W/Cm &/Or Wo Cm  Addendum Date: 07/26/2021   ADDENDUM REPORT: 07/26/2021 18:50 ADDENDUM: These results were called by telephone at the time of interpretation on 07/26/2021 at 6:50 pm to provider Dr. Jacqulyn Bath, who verbally acknowledged these results. Electronically Signed   By: Tish Frederickson M.D.   On: 07/26/2021 18:50   Result Date: 07/26/2021 CLINICAL DATA:  Pulmonary embolism (PE) suspected, high prob. Epigastric upper abdominal pain. Shortness of breath. EXAM: CT ANGIOGRAPHY CHEST WITH CONTRAST TECHNIQUE: Multidetector CT imaging of the chest was performed using the standard protocol during bolus administration of intravenous contrast. Multiplanar CT image reconstructions and MIPs were obtained to evaluate the vascular anatomy. RADIATION DOSE REDUCTION: This exam was performed according to the departmental dose-optimization program which includes automated exposure control, adjustment of the mA and/or kV according to patient size and/or use of iterative reconstruction technique. CONTRAST:  80mL OMNIPAQUE IOHEXOL 350 MG/ML SOLN COMPARISON:  Chest x-ray 07/26/2021 FINDINGS: Cardiovascular: Satisfactory opacification of the pulmonary arteries to the segmental level. Filling defect of the right upper lobe, right lower lobe, left upper lobe, left lower lobe segmental and subsegmental pulmonary arteries. The main pulmonary artery is normal in  caliber. Normal heart size. No pericardial effusion. Mediastinum/Nodes: No enlarged mediastinal, hilar, or axillary lymph nodes. Thyroid gland, trachea, and esophagus demonstrate no significant findings. Lungs/Pleura: Bilateral lower lobe patchy consolidation. No focal consolidation.  No pulmonary nodule. No pulmonary mass. Trace bilateral pleural effusions. No pneumothorax. Upper Abdomen: No acute abnormality. Musculoskeletal: No chest wall abnormality. No suspicious lytic or blastic osseous lesions. No acute displaced fracture. Multilevel degenerative changes of the spine. Review of the MIP images confirms the above findings. IMPRESSION: 1. Segmental and subsegmental pulmonary arteries of the left upper, left lower, right upper, right lower lobes. No findings of right heart strain or pulmonary infarction. 2. Bilateral lower lobe patchy consolidation. Finding could represent a combination of atelectasis and/or infection/inflammation. Limited evaluation due to timing of contrast. 3. Trace bilateral pleural effusions. Electronically Signed: By: Tish Frederickson M.D. On: 07/26/2021 18:44   US Abdomen Limited RUQ (LIVER/GB)  Result Date: 07/26/2021 CLINICAL DATA:  Transaminitis. Epigastric pain. Autoimmune hepatitis. Cholangitis. EXAM: ULTRASOUND ABDOMEN LIMITED RIGHT UPPER QUADRANT COMPARISON:  MRI abdomen 01/22/2012; right upper quadrant abdominal ultrasound 01/10/2012; MRI abdomen 05/18/2021; FINDINGS: Gallbladder: The gallbladder was difficult to visualize due to overlying bowel gas. Normal gallbladder wall thickness of 2 mm. Negative sonographic Murphy's sign. No gallstone, gallbladder sludge, or pericholecystic fluid. Common bile duct: Diameter: 2 mm, within normal limits. Note is made of peripheral segmental intrahepatic biliary ductal dilatation better seen on prior MRI. Liver: Minimal nodular liver contours, similar to prior. There is diffuse heterogeneous echogenicity. Within the anterior left hepatic lobe  there is an echogenic mass measuring up to approximately 3.0 x 1.6 x 2.9 cm. Portal vein is patent on color Doppler imaging with normal direction of blood flow towards the liver. Other: None. IMPRESSION: 1. Normal appearance of the gallbladder. 2. 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 3. An echogenic mass measured within the anterior left hepatic lobe is favored to represent a region of enhancing fibrosis and sclerosing parenchymal contraction with segmental biliary ductal dilatation in a similar region better seen on prior MRI 05/18/2021. Electronically Signed   By: Neita Garnet M.D.   On: 07/26/2021 12:56   DG Chest 2 View  Result Date: 07/26/2021 CLINICAL DATA:  Shortness of breath, epigastric pain EXAM: CHEST - 2 VIEW COMPARISON:  None Available. FINDINGS: Cardiomediastinal silhouette is within normal limits. There are hazy bibasilar airspace opacities. No large effusion. No pneumothorax. No acute osseous abnormality. Levoconvex thoracic and dextroconvex lumbar curvature is. Mild degenerative changes of the spin. IMPRESSION: Hazy bibasilar airspace opacities, favored to represent subsegmental atelectasis Electronically Signed   By: Caprice Renshaw M.D.   On: 07/26/2021 11:30   Labs:   Basic Metabolic Panel: Recent Labs  Lab 07/26/21 1111 07/27/21 1329 07/28/21 0617  NA 137 136 137  K 3.5 3.5 3.7  CL 110 107 104  CO2 20* 19* 24  GLUCOSE 89 150* 110*  BUN 7 7 7   CREATININE 0.53 0.62 0.68  CALCIUM 9.0 8.9 9.3     CBC: Recent Labs  Lab 07/26/21 1210 07/27/21 0833 07/28/21 0617  WBC 8.8 11.5* 10.1  NEUTROABS 7.0 9.1*  --   HGB 15.3* 15.5* 14.7  HCT 43.1 44.9 41.8  MCV 76.7* 80.2 77.1*  PLT 130* PLATELET CLUMPS NOTED ON SMEAR, UNABLE TO ESTIMATE 149*         SIGNED:   09/27/21, MD  [facility] 07/29/2021, 3:17 PM
